# Patient Record
Sex: Female | Born: 1970
Health system: Southern US, Community
[De-identification: ages and names within clinical notes are randomized; demographics above are authoritative.]

## PROBLEM LIST (undated history)

## (undated) DIAGNOSIS — K219 Gastro-esophageal reflux disease without esophagitis: Secondary | ICD-10-CM

## (undated) DIAGNOSIS — C73 Malignant neoplasm of thyroid gland: Secondary | ICD-10-CM

## (undated) DIAGNOSIS — D649 Anemia, unspecified: Secondary | ICD-10-CM

## (undated) DIAGNOSIS — M797 Fibromyalgia: Secondary | ICD-10-CM

## (undated) DIAGNOSIS — O2690 Pregnancy related conditions, unspecified, unspecified trimester: Secondary | ICD-10-CM

## (undated) DIAGNOSIS — Z9889 Other specified postprocedural states: Secondary | ICD-10-CM

## (undated) DIAGNOSIS — E063 Autoimmune thyroiditis: Secondary | ICD-10-CM

## (undated) DIAGNOSIS — R112 Nausea with vomiting, unspecified: Secondary | ICD-10-CM

## (undated) DIAGNOSIS — R011 Cardiac murmur, unspecified: Secondary | ICD-10-CM

## (undated) HISTORY — DX: Anemia, unspecified: D64.9

## (undated) HISTORY — DX: Cardiac murmur, unspecified: R01.1

## (undated) HISTORY — DX: Malignant neoplasm of thyroid gland: C73

## (undated) HISTORY — DX: Pregnancy related conditions, unspecified, unspecified trimester: O26.90

## (undated) HISTORY — DX: Gastro-esophageal reflux disease without esophagitis: K21.9

## (undated) HISTORY — DX: Fibromyalgia: M79.7

## (undated) HISTORY — DX: Nausea with vomiting, unspecified: R11.2

## (undated) HISTORY — DX: Other specified postprocedural states: Z98.890

## (undated) HISTORY — PX: LAPAROSCOPY: SHX197

## (undated) HISTORY — DX: Autoimmune thyroiditis: E06.3

---

## 1997-08-14 ENCOUNTER — Other Ambulatory Visit: Admission: RE | Admit: 1997-08-14 | Discharge: 1997-08-14 | Payer: Self-pay | Admitting: Family Medicine

## 1998-03-23 ENCOUNTER — Other Ambulatory Visit: Admission: RE | Admit: 1998-03-23 | Discharge: 1998-03-23 | Payer: Self-pay | Admitting: *Deleted

## 1998-06-15 ENCOUNTER — Emergency Department (HOSPITAL_COMMUNITY): Admission: EM | Admit: 1998-06-15 | Discharge: 1998-06-15 | Payer: Self-pay | Admitting: Family Medicine

## 1998-08-06 ENCOUNTER — Encounter (INDEPENDENT_AMBULATORY_CARE_PROVIDER_SITE_OTHER): Payer: Self-pay

## 1998-08-06 ENCOUNTER — Ambulatory Visit (HOSPITAL_COMMUNITY): Admission: RE | Admit: 1998-08-06 | Discharge: 1998-08-06 | Payer: Self-pay | Admitting: *Deleted

## 1999-03-24 ENCOUNTER — Other Ambulatory Visit: Admission: RE | Admit: 1999-03-24 | Discharge: 1999-03-24 | Payer: Self-pay | Admitting: Obstetrics and Gynecology

## 2000-03-28 ENCOUNTER — Other Ambulatory Visit: Admission: RE | Admit: 2000-03-28 | Discharge: 2000-03-28 | Payer: Self-pay | Admitting: Obstetrics and Gynecology

## 2000-04-19 ENCOUNTER — Encounter (INDEPENDENT_AMBULATORY_CARE_PROVIDER_SITE_OTHER): Payer: Self-pay | Admitting: Specialist

## 2000-04-19 ENCOUNTER — Ambulatory Visit (HOSPITAL_COMMUNITY): Admission: RE | Admit: 2000-04-19 | Discharge: 2000-04-19 | Payer: Self-pay | Admitting: Gastroenterology

## 2001-04-03 ENCOUNTER — Other Ambulatory Visit: Admission: RE | Admit: 2001-04-03 | Discharge: 2001-04-03 | Payer: Self-pay | Admitting: Obstetrics and Gynecology

## 2002-02-21 DIAGNOSIS — C73 Malignant neoplasm of thyroid gland: Secondary | ICD-10-CM

## 2002-02-21 DIAGNOSIS — E063 Autoimmune thyroiditis: Secondary | ICD-10-CM

## 2002-02-21 HISTORY — PX: THYROIDECTOMY: SHX17

## 2002-02-21 HISTORY — DX: Malignant neoplasm of thyroid gland: C73

## 2002-02-21 HISTORY — DX: Autoimmune thyroiditis: E06.3

## 2002-03-05 ENCOUNTER — Inpatient Hospital Stay (HOSPITAL_COMMUNITY): Admission: AD | Admit: 2002-03-05 | Discharge: 2002-03-08 | Payer: Self-pay | Admitting: Obstetrics and Gynecology

## 2002-03-06 ENCOUNTER — Encounter (INDEPENDENT_AMBULATORY_CARE_PROVIDER_SITE_OTHER): Payer: Self-pay | Admitting: Specialist

## 2002-04-17 ENCOUNTER — Other Ambulatory Visit: Admission: RE | Admit: 2002-04-17 | Discharge: 2002-04-17 | Payer: Self-pay | Admitting: Obstetrics and Gynecology

## 2002-08-29 ENCOUNTER — Encounter: Payer: Self-pay | Admitting: Family Medicine

## 2002-08-29 ENCOUNTER — Encounter: Admission: RE | Admit: 2002-08-29 | Discharge: 2002-08-29 | Payer: Self-pay | Admitting: Family Medicine

## 2002-09-05 ENCOUNTER — Ambulatory Visit (HOSPITAL_COMMUNITY): Admission: RE | Admit: 2002-09-05 | Discharge: 2002-09-05 | Payer: Self-pay | Admitting: Family Medicine

## 2002-09-05 ENCOUNTER — Encounter (INDEPENDENT_AMBULATORY_CARE_PROVIDER_SITE_OTHER): Payer: Self-pay

## 2002-09-05 ENCOUNTER — Encounter: Payer: Self-pay | Admitting: Family Medicine

## 2002-11-01 ENCOUNTER — Encounter: Payer: Self-pay | Admitting: Surgery

## 2002-11-01 ENCOUNTER — Encounter (INDEPENDENT_AMBULATORY_CARE_PROVIDER_SITE_OTHER): Payer: Self-pay | Admitting: *Deleted

## 2002-11-01 ENCOUNTER — Observation Stay (HOSPITAL_COMMUNITY): Admission: RE | Admit: 2002-11-01 | Discharge: 2002-11-02 | Payer: Self-pay | Admitting: Surgery

## 2002-12-02 ENCOUNTER — Encounter (HOSPITAL_COMMUNITY): Admission: RE | Admit: 2002-12-02 | Discharge: 2003-03-02 | Payer: Self-pay | Admitting: Endocrinology

## 2002-12-05 ENCOUNTER — Encounter: Payer: Self-pay | Admitting: Endocrinology

## 2002-12-09 ENCOUNTER — Encounter: Payer: Self-pay | Admitting: Endocrinology

## 2003-04-25 ENCOUNTER — Other Ambulatory Visit: Admission: RE | Admit: 2003-04-25 | Discharge: 2003-04-25 | Payer: Self-pay | Admitting: Obstetrics and Gynecology

## 2003-07-28 ENCOUNTER — Encounter (HOSPITAL_COMMUNITY): Admission: RE | Admit: 2003-07-28 | Discharge: 2003-10-26 | Payer: Self-pay | Admitting: Endocrinology

## 2004-09-13 ENCOUNTER — Encounter (HOSPITAL_COMMUNITY): Admission: RE | Admit: 2004-09-13 | Discharge: 2004-12-12 | Payer: Self-pay | Admitting: Endocrinology

## 2004-09-28 ENCOUNTER — Ambulatory Visit (HOSPITAL_COMMUNITY): Admission: RE | Admit: 2004-09-28 | Discharge: 2004-09-28 | Payer: Self-pay | Admitting: Endocrinology

## 2004-12-02 ENCOUNTER — Encounter (INDEPENDENT_AMBULATORY_CARE_PROVIDER_SITE_OTHER): Payer: Self-pay | Admitting: *Deleted

## 2004-12-02 ENCOUNTER — Ambulatory Visit (HOSPITAL_COMMUNITY): Admission: RE | Admit: 2004-12-02 | Discharge: 2004-12-03 | Payer: Self-pay | Admitting: Surgery

## 2006-05-23 ENCOUNTER — Encounter: Admission: RE | Admit: 2006-05-23 | Discharge: 2006-05-23 | Payer: Self-pay | Admitting: Obstetrics and Gynecology

## 2006-07-10 ENCOUNTER — Ambulatory Visit: Payer: Self-pay | Admitting: Family Medicine

## 2006-07-10 DIAGNOSIS — R21 Rash and other nonspecific skin eruption: Secondary | ICD-10-CM | POA: Insufficient documentation

## 2006-07-12 ENCOUNTER — Telehealth: Payer: Self-pay | Admitting: Family Medicine

## 2006-07-18 ENCOUNTER — Ambulatory Visit: Payer: Self-pay | Admitting: Family Medicine

## 2006-07-18 DIAGNOSIS — L259 Unspecified contact dermatitis, unspecified cause: Secondary | ICD-10-CM | POA: Insufficient documentation

## 2006-07-20 ENCOUNTER — Ambulatory Visit: Payer: Self-pay | Admitting: Family Medicine

## 2006-08-07 ENCOUNTER — Encounter: Payer: Self-pay | Admitting: Family Medicine

## 2008-12-12 ENCOUNTER — Ambulatory Visit: Payer: Self-pay | Admitting: Family Medicine

## 2008-12-12 DIAGNOSIS — B354 Tinea corporis: Secondary | ICD-10-CM | POA: Insufficient documentation

## 2008-12-13 LAB — CONVERTED CEMR LAB
ALT: 11 units/L (ref 0–35)
CO2: 24 meq/L (ref 19–32)
Chloride: 105 meq/L (ref 96–112)
Creatinine, Ser: 0.71 mg/dL (ref 0.40–1.20)
Potassium: 4.6 meq/L (ref 3.5–5.3)
Sodium: 140 meq/L (ref 135–145)

## 2009-02-04 ENCOUNTER — Encounter: Admission: RE | Admit: 2009-02-04 | Discharge: 2009-02-04 | Payer: Self-pay | Admitting: Surgery

## 2009-03-26 ENCOUNTER — Ambulatory Visit (HOSPITAL_COMMUNITY): Admission: RE | Admit: 2009-03-26 | Discharge: 2009-03-26 | Payer: Self-pay | Admitting: Otolaryngology

## 2009-10-07 ENCOUNTER — Ambulatory Visit: Payer: Self-pay | Admitting: Family Medicine

## 2009-10-07 DIAGNOSIS — T22119A Burn of first degree of unspecified forearm, initial encounter: Secondary | ICD-10-CM | POA: Insufficient documentation
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
## 2010-05-14 ENCOUNTER — Other Ambulatory Visit: Payer: Self-pay | Admitting: Obstetrics and Gynecology

## 2010-05-14 LAB — CBC
Hemoglobin: 14.8 g/dL (ref 12.0–15.0)
MCV: 90.8 fL (ref 78.0–100.0)
Platelets: 281 10*3/uL (ref 150–400)
RBC: 4.76 MIL/uL (ref 3.87–5.11)
WBC: 8.9 10*3/uL (ref 4.0–10.5)

## 2010-05-14 LAB — APTT: aPTT: 29 seconds (ref 24–37)

## 2010-05-14 LAB — PROTIME-INR
INR: 0.99 (ref 0.00–1.49)
Prothrombin Time: 13 seconds (ref 11.6–15.2)

## 2010-06-15 ENCOUNTER — Other Ambulatory Visit: Payer: Self-pay | Admitting: Obstetrics and Gynecology

## 2010-06-15 DIAGNOSIS — Z1231 Encounter for screening mammogram for malignant neoplasm of breast: Secondary | ICD-10-CM

## 2010-06-29 ENCOUNTER — Encounter: Payer: Self-pay | Admitting: Family Medicine

## 2010-06-29 ENCOUNTER — Ambulatory Visit (INDEPENDENT_AMBULATORY_CARE_PROVIDER_SITE_OTHER): Payer: 59 | Admitting: Family Medicine

## 2010-06-29 DIAGNOSIS — R011 Cardiac murmur, unspecified: Secondary | ICD-10-CM

## 2010-06-29 DIAGNOSIS — R002 Palpitations: Secondary | ICD-10-CM

## 2010-06-29 DIAGNOSIS — K219 Gastro-esophageal reflux disease without esophagitis: Secondary | ICD-10-CM

## 2010-06-29 MED ORDER — RANITIDINE HCL 150 MG PO TABS: 150.0000 mg | ORAL_TABLET | Freq: Two times a day (BID) | ORAL | Status: DC

## 2010-06-29 NOTE — Patient Instructions (Addendum)
Adhere to reflux precautions.   Start Zantac 2 x a day.  2D echo ordered. Return for recheck BP/ palpitations in 1 month.  Diet for GERD or PUD Nutrition therapy can help ease the discomfort of gastroesophageal reflux disease (GERD) and peptic ulcer disease (PUD).  HOME CARE INSTRUCTIONS  Eat your meals slowly, in a relaxed setting.   Eat 5 to 6 small meals per day.   If a food causes distress, stop eating it for a period of time.  FOODS TO AVOID:  Coffee, regular or decaffeinated.  Cola beverages, regular or low calorie.   Tea, regular or decaffeinated.   Pepper.   Cocoa.   High fat foods including meats.   Butter, margarine, hydrogenated oil (trans fats).  Peppermint or spearmint (if you have GERD).   Fruits and vegetables as tolerated.   Alcoholic beverages.   Nicotine (smoking or chewing). This is one of the most potent stimulants to acid production in the gastrointestinal tract.   Any food that seems to aggravate your condition.   If you have questions regarding your diet, call your caregiver's office or a registered dietitian. OTHER TIPS IF YOU HAVE GERD:  Lying flat may make symptoms worse. Keep the head of your bed raised 6 to 9 inches by using a foam wedge or blocks under the legs of the bed.   Do not lay down until 3 hours after eating a meal.   Daily physical activity may help reduce symptoms.  MAKE SURE YOU:   Understand these instructions.   Will watch your condition.   Will get help right away if you are not doing well or get worse.  Document Released: 02/07/2005 Document Re-Released: 06/26/2008 [facility] Patient Information 2011 [place], [place].

## 2010-06-29 NOTE — Assessment & Plan Note (Signed)
New finding of systolic murmur.  Since she also has new onset palpitations and may have had radiation from her thyroid cancer to this region, will get a 2D echo and f/u results.

## 2010-06-29 NOTE — Assessment & Plan Note (Signed)
Sara Payne likely has reflux given her presentation of symptoms after eating too much spicy [place] food on 2 separate occasions.  Will start her on Zantac 150 mg bid everyday for the next [redacted] wks along with lifestyle changes.  H/o given to pt on reflux precautions.

## 2010-06-29 NOTE — Progress Notes (Signed)
  Subjective:    Patient ID: Sara Payne, female    DOB: 01-08-1971, 40 y.o.   MRN: [id number]  HPI 40 yo WF presents for feeling bad one night after eating tacos.  She thinks she ate too much.  She had a racing heart that night.  She called call a nurse.  She was not short of breath.  Denies feeling heartburn, epigastric pain or shortness of breath.  She did not feel like her heart was beating too fast, it was just beating hard.  She had just had her thyroid checked in march and it was normal.  She did not take anything that night but the following morning, she had chest pain.  She went to the gym and had chest pain that morning.  [facility]ent to [facility] and had CXR and EKG at the time of the constant chest pain.  She was told that she had acid reflux.  She was told to take prevacid but it has not helped.  She is feeling more heartburn.  She then changed to prilosec which helped.  She feels like the Prilosec is giving her heart palpitations.  She ate [place] Sat night and she had palpitations for hours after eating.  She consumes 0-1 caffeinated drinks/ day.  She tried gas ex.  Her stools are a little looser but no melena or blood in her stool.  She had been stressed out at the time.  She had not been sleeping as well.     Review of Systems  Constitutional: Negative for fever, appetite change, fatigue and unexpected weight change.  HENT: Negative for neck pain.   Respiratory: Negative for cough, choking, chest tightness, shortness of breath, wheezing and stridor.   Cardiovascular: Positive for chest pain and palpitations. Negative for leg swelling.  Gastrointestinal: Negative for nausea, vomiting, abdominal pain, diarrhea, constipation, blood in stool and abdominal distention.  Skin: Negative for color change and pallor.  Neurological: Negative for light-headedness and headaches.  Psychiatric/Behavioral: Negative for dysphoric mood. The patient is nervous/anxious.        Objective:   Physical Exam    Constitutional: She appears well-developed and well-nourished. No distress.  HENT:  Head: Normocephalic and atraumatic.  Eyes: Conjunctivae are normal. No scleral icterus.  Neck: Neck supple. No thyromegaly present.  Cardiovascular: Normal rate and regular rhythm.   Murmur heard.  Systolic murmur is present with a grade of 2/6  Pulmonary/Chest: Effort normal and breath sounds normal. No respiratory distress. She has no wheezes.  Abdominal: Bowel sounds are normal. She exhibits no distension. There is no tenderness. There is no guarding.  Musculoskeletal: She exhibits no edema.  Lymphadenopathy:    She has no cervical adenopathy.  Skin: Skin is warm and dry. No pallor.  Psychiatric: She has a normal mood and affect.          Assessment & Plan:

## 2010-06-29 NOTE — Assessment & Plan Note (Signed)
No sign of arrythmia today.  Had a normal CXR and EKG at [facility] for the same 2 wks ago, will get records.  Also had normal labs.  Will proceed with the echo given concurrent soft systolic murmur and if treating her reflux does not resolve the palpitations, will get her in with cards for a holter monitor.

## 2010-07-08 ENCOUNTER — Telehealth: Payer: Self-pay | Admitting: Family Medicine

## 2010-07-08 DIAGNOSIS — R002 Palpitations: Secondary | ICD-10-CM

## 2010-07-08 NOTE — Telephone Encounter (Signed)
Pt called and wants to know if she should continue with the Zantac 150 mg PO BID.  Having problems.  Seen last week for palpitations and placed on medication (Zantac) and she didn't take med yesterday and she didn't have any problems.  Today took pill this morning and today she feels like she can't get her breathe.  Had a few flutters today.  Slept sitting in upward position last night.  Pt sounds as though she is doing all the right things for the reflux. When first placed on Zantac the palpitations went away, and didn't take med yest and today starting with palpitations again.   Please advise. Plan:  Routed to Dr. Arlice Colt, LPN Domingo Dimes

## 2010-07-08 NOTE — Telephone Encounter (Signed)
OK to stay OFF Zantac and I will go ahead and set her up to see Dr Jens Som (cardiologist downstairs) for Holter monitoring.

## 2010-07-09 ENCOUNTER — Telehealth: Payer: Self-pay | Admitting: Family Medicine

## 2010-07-09 DIAGNOSIS — K219 Gastro-esophageal reflux disease without esophagitis: Secondary | ICD-10-CM

## 2010-07-09 NOTE — Op Note (Signed)
Sara Payne, Sara Payne                           ACCOUNT NO.:  [id number]   MEDICAL RECORD NO.:  [id number]                   PATIENT TYPE:  OBV   LOCATION:  0468                                 FACILITY:  [facility]   PHYSICIAN:  Velora Heckler, M.D.                DATE OF BIRTH:  Apr 15, 1970   DATE OF PROCEDURE:  11/01/2002  DATE OF DISCHARGE:                                 OPERATIVE REPORT   PREOPERATIVE DIAGNOSIS:  Thyroid nodule.   POSTOPERATIVE DIAGNOSIS:  Papillary thyroid carcinoma with regional lymph  node metastasis.   PROCEDURES:  1. Total thyroidectomy.  2. Limited regional lymph node dissection.   SURGEON:  Velora Heckler, M.D.   ASSISTANT:  Currie Paris, M.D.   ANESTHESIA:  General by Dr. Leta Jungling.   ESTIMATED BLOOD LOSS:  Minimal.   PREPARATION:  Betadine.   COMPLICATIONS:  None.   INDICATIONS:  The patient is a pleasant, 40 year old, white female, seen at  the request of Dorisann Frames, M.D. for thyroid nodule with atypia on fine  needle aspiration.  The patient had been found to have a thyroid nodule on  physical exam by Lemmie Evens, M.D. in May 2004.  She underwent thyroid  ultrasound.  This showed a solid, ill-defined mass worrisome for carcinoma.  The patient was seen by Dr. Talmage Nap and underwent fine needle aspiration  cytology.  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  The patient was then referred to general surgery for  resection.   DESCRIPTION OF PROCEDURE:  The procedure is done in OR #6 at the [facility].  The patient is brought to the operating room and placed  in a supine position on the operating room table.  Following the  administration of general anesthesia, the patient is positioned and then  prepped and draped in the usual strict aseptic fashion.  After ascertaining  that an adequate level of anesthesia had been obtained, a Kocher incision is  made with a #10 blade.   Dissection is carried down through the skin and  subcutaneous tissues.  Platysma is divided, and hemostasis is obtained with  the electrocautery.  Skin flaps are raised cephalad and caudad from the  thyroid notch to the sternal notch.  A Mahorner self-retaining retractor is  placed for exposure.  Strap muscles are incised in the midline, and  dissection is begun on the left side.  Strap muscles are reflected laterally  off the surface of the thyroid.  Thyroid is quite firm.  The nodular density  occupies essentially the entire left lobe.  Venous tributaries are divided  between small Ligaclips.  Superior pole is mobilized and ligated in  continuity with 2-0 silk ties and medium Ligaclips and divided.  Gland is  rolled anteriorly.  Branches of the inferior thyroid artery are divided  between small Ligaclips.  Parathyroid tissue is  identified and preserved.  Gland is rolled further anteriorly.  Branches of the inferior thyroid artery  are divided between small Ligaclips.  Ligament of Allyson Sabal is then transected,  and the gland is rolled up and onto the anterior surface of the trachea.  Isthmus is mobilized with the electrocautery.  The isthmus is divided at its  junction with the right thyroid lobe between hemostats.  Specimen is  sectioned on the field and has an ill-defined, fibrous mass in the left  lobe.  It is submitted to pathology for review.  Right lobe is suture  ligated with 3-0 Vicryl suture ligatures.   Marcie Bal, M.D. reviewed the left lobe on frozen section and confirms  papillary thyroid carcinoma.  Also, a left jugular lymph node was dissected  out from behind the carotid sheath.  Vascular pedicle lymph node was divided  between medium Ligaclips.  Node measured approximately 1 cm in size.  It  also was submitted for frozen section, and Marcie Bal, M.D. confirms  metastatic papillary carcinoma involving this lymph node.   At this point, a decision is made to proceed  with right thyroid lobectomy.  Strap muscles are again reflected laterally.  Venous tributaries are divided  between small Ligaclips.  Superior pole vessels are again ligated in  continuity with 2-0 silk ties and medium Ligaclips and divided.  Parathyroid  tissue is identified and preserved.  Gland is rolled anteriorly.  There is a  large tubercle of Zucker candle on the right side which is carefully  dissected out.  Recurrent laryngeal nerve is identified and preserved.  Branches of the inferior thyroid artery are divided between small Ligaclips.  Gland is rolled anteriorly and resected off of the trachea using the  electrocautery for hemostasis.  Right lobe is submitted to pathology for  permanent review.  The anterior compartment lymph nodes are excised using  the electrocautery for hemostasis.  A Delphian lymph node just above the  isthmus and to the right of midline is also resected and submitted.  Good  hemostasis is noted throughout.  Surgicel is placed in the area of the lymph  node resections as well as over the recurrent laryngeal nerves bilaterally.  Strap muscles are then reapproximated in the midline with interrupted 3-0  Vicryl sutures.  Platysma is closed with interrupted 3-0 Vicryl sutures.  Skin edges are reapproximated with a running 4-0 Vicryl subcuticular suture.  Wound is washed and dried, and Benzoin and Steri-Strips are applied.  Sterile gauze dressings are applied.  The patient is awakened from  anesthesia and brought to the recovery room in stable condition.  The  patient tolerated the procedure well.                                               Velora Heckler, M.D.    TMG/MEDQ  D:  11/01/2002  T:  11/02/2002  Job:  [id number]   cc:   Dorisann Frames, M.D.  [address] [place] [address]  Fax: [phone]   Danielle L. Mahaffey, M.D.  [address]  [place]  [place] [address]  Fax: [phone]  Lemmie Evens, M.D.  [address]   [place]  [place] [address]  Fax: [phone]

## 2010-07-09 NOTE — Procedures (Signed)
[facility]  Patient:    Sara Payne, Sara Payne                        MRN: [id number] Proc. Date: 04/19/00 Adm. Date:  [id number] Attending:  Louie Bun CC:         Doreatha Lew, M.D.                           Procedure Report  PROCEDURE:  Esophagogastroduodenoscopy.  ENDOSCOPIST:  Everardo All. Madilyn Fireman, M.D.  INDICATIONS:  Reflux-like dyspepsia of sudden onset several months ago with no sustained response to acid suppression.  DESCRIPTION OF PROCEDURE:  The patient was placed in the left lateral decubitus position and placed on the pulse monitor with continuous low flow oxygen delivered by nasal cannula.  She was sedated with 50 mg of IV Demerol and 7 mg of IV Versed.  The Olympus video endoscope was advanced under direct vision into the oropharynx and esophagus.  The esophagus was straight and of normal caliber with the squamocolumnar line at 38 cm.  There was no visible hiatal hernia, ring, stricture or other abnormality of the GE junction.  The stomach was entered, and a small amount of liquid secretions were suctioned from the fundus.  Retroflexed view of the cardia was unremarkable.  The fundus, body, antrum and pylorus all appeared normal.  The duodenum was entered.  Both the bulb and second portion were inspected and appeared to be within normal limits.  The scope was advanced as far as possible down into the duodenum, and biopsies were taken to rule out celiac disease.  The scope was withdrawn back into the stomach and CLO test was obtained.  The scope was then withdrawn, and the patient returned to the recovery room in stable condition. He tolerated the procedure well, and there were no immediate complications.  IMPRESSION:  Essentially normal endoscopy.  PLAN:  Await CLO test and small bowel biopsies.  If negative, we will consider a course of Reglan and possible gallbladder ultrasound. DD:  04/20/00 TD:  04/20/00 Job: [id number]

## 2010-07-09 NOTE — Op Note (Signed)
NAMEMARENDA, Sara Payne                 ACCOUNT NO.:  [id number]   MEDICAL RECORD NO.:  [id number]          PATIENT TYPE:  OIB   LOCATION:  1611                         FACILITY:  [facility]   PHYSICIAN:  Velora Heckler, MD      DATE OF BIRTH:  09-08-1970   DATE OF PROCEDURE:  12/02/2004  DATE OF DISCHARGE:                                 OPERATIVE REPORT   PREOPERATIVE DIAGNOSIS:  Left internal jugular lymphadenopathy, rule out  recurrent papillary thyroid carcinoma.   POSTOPERATIVE DIAGNOSIS:  Left internal jugular lymphadenopathy, rule out  recurrent papillary thyroid carcinoma.   PROCEDURE:  Left internal jugular lymph node excision.   SURGEON:  Velora Heckler, M.D.   ANESTHESIA:  General.   ESTIMATED BLOOD LOSS:  Minimal.   PREPARATION:  Betadine.   COMPLICATIONS:  None.   INDICATIONS:  The patient is a 40 year old white female from [place],  [place], well known to my surgical practice.  The patient had  undergone total thyroidectomy September 2004 for papillary thyroid carcinoma  with lymph node metastasis.  The patient was treated postoperatively with  radioactive iodine. The patient developed a globus sensation in the throat.  Thyroid ultrasound demonstrated a 2.4 cm lymph node in the left internal  jugular chain.  The patient was seen in consultation by Dr. Leonie Man at  [facility].  PET scan was obtained which showed no  evidence of metastatic disease. Radioactive iodine scanning showed no  evidence of recurrent disease.  The patient now comes to surgery for lymph  node excision.   DESCRIPTION OF PROCEDURE:  The procedure was done in OR #1 at the [facility].  The patient was brought to the operating room and  placed in the supine position on the operating room table.  Following  administration of general anesthesia, the patient is positioned and then  prepped and draped in the usual strict aseptic fashion.  After  ascertaining  that an adequate level of anesthesia been obtained, the left half of the  patient's previous Kocher incision is reopened with a #15 blade.  Dissection  was carried down through scar tissue. Platysma was divided.  Skin flaps were  elevated cephalad and caudad.  A Weitlaner r retractor was placed for  exposure.  Strap muscles were incised in the midline and reflected  laterally.  Dissection was carried out to the carotid sheath which was  carefully opened.  The carotid artery and vagus nerve were reflected  medially.  Jugular vein is reflected laterally.  The enlarged lymph node is  apparent just behind the jugular vein.  It is gently dissected out. Vascular  tributaries were divided between small and medium Ligaclips.  The entire  lymph node is excised.  It measures approximately 2.4 cm in greatest  dimension.  It is submitted fresh to pathology for Dr. Esther Hardy to  analyze on permanent sections.  Good hemostasis was noted.  A small piece of  Surgicel was placed in the bed of the lymph node.  The strap muscles were  reapproximated in  the midline with interrupted 3-0 Vicryl sutures.  Platysma  was closed with interrupted 3-0 Vicryl sutures. Skin was closed with a  running 4-0 Vicryl subcuticular suture.  Wound is washed and dried. Benzoin, Steri-Strips were applied.  Sterile  dressings were applied.  The patient is awakened from anesthesia and brought  to the recovery room in stable condition.  The patient tolerated the  procedure well.      Velora Heckler, MD  Electronically Signed     TMG/MEDQ  D:  12/02/2004  T:  12/03/2004  Job:  [id number]   cc:   Deboraha Sprang at Fernanda Drum, M.D.  Division of Endocrincology  [facility]

## 2010-07-09 NOTE — Telephone Encounter (Signed)
Pt called and she is experiencing more GI related symptoms along with heart palpitations.  Difficulty with breathing, and reflux after eating anything.  No better.   Plan:  Checked the status of referrals to the cardiologist.  Orders in system but the appt to see card and to do the 2-D echo not done.  Therefore, triage nurse called on behalf of the patient and scheduled card appt for 08-11-10 @ 4pm/ [place] office and 2-D echo appt/ [place] for 07-21-10 @1pm  .  LMOM for patient with all this pertinent information.  Told pt we will also like to refer her to [facility] in [place] to r/o reflux which seems to be significant at this point per Dr. Ovidio Kin order.  Pending pt call back to determine if willing to also do GI referral. Plan:  Routed to Dr. Cathey Endow for reference Jarvis Newcomer, LPN Domingo Dimes

## 2010-07-09 NOTE — Op Note (Signed)
[facility]  Patient:    Sara Payne, Sara Payne                        MRN: [id number] Proc. Date: 04/19/00 Adm. Date:  [id number] Attending:  Louie Bun CC:         Doreatha Lew, M.D.                           Operative Report  PROCEDURE:  Esophagogastroduodenoscopy.  ENDOSCOPIST:  Everardo All. Madilyn Fireman, M.D.  INDICATIONS: DD:  04/20/00 TD:  04/20/00 Job: [id number]

## 2010-07-09 NOTE — H&P (Signed)
   Sara Payne, Sara Payne                           ACCOUNT NO.:  [id number]   MEDICAL RECORD NO.:  [id number]                   PATIENT TYPE:  INP   LOCATION:  9162                                 FACILITY:  [facility]   PHYSICIAN:  Lenoard Aden, M.D.             DATE OF BIRTH:  [birth year]   DATE OF ADMISSION:  03/05/2002  DATE OF DISCHARGE:                                HISTORY & PHYSICAL   CHIEF COMPLAINT:  Spontaneous ruptured membranes.   HISTORY OF PRESENT ILLNESS:  The patient is a 40 year old white female, G1,  P0, EDD of March 13, 2001, at [redacted] weeks gestation who presents in active  labor status post spontaneous ruptured membranes.   ALLERGIES:  She has allergies to Z-PAK medications, PRENATAL VITAMINS.   PAST OB/GYN HISTORY:  History of SAB with D&E in 2000.  History of  laparoscopy in 2000 for presumed ectopic conception on Clomid.  Previous  history of Chlamydia.   FAMILY HISTORY:  Postpartum depression, kidney stones, breast cancer.   POST PRENATAL COURSE:  Complicated by size/date discrepancy with normal AGA  fetus with recent ultrasound performed on February 11, 2002, reveals an AGA  fetus, resolution of low-lying placenta and estimated fetal weight at the  29% percentile of approximately 5.5 pounds.   PHYSICAL EXAMINATION:  GENERAL:  She is a well-developed, well-nourished  white female in no apparent distress.  HEENT:  Normal.  LUNGS:  Clear.  HEART:  Regular rhythm.  ABDOMEN:  Soft, gravid, nontender.  Estimated fetal weight 6.5 to 7 pounds.  CERVIX:  2-3 cm, 80% minus 1, clear fluid noted.  EXTREMITIES:  There are no cords.  NEUROLOGIC EXAM:  Nonfocal.   IMPRESSION:  Term intrauterine pregnancy with spontaneous ruptured  membranes, GBS negative.   PLAN:  Pitocin augmentation, anticipated attempts at vaginal delivery.                                               Lenoard Aden, M.D.    RJT/MEDQ  D:  03/06/2002  T:  03/06/2002  Job:  [id number]

## 2010-07-09 NOTE — Op Note (Signed)
Sara Payne, Sara Payne                           ACCOUNT NO.:  [id number]   MEDICAL RECORD NO.:  [id number]                   PATIENT TYPE:  INP   LOCATION:  9162                                 [facility]ILITY[facility]   PHYSICIAN:  Lenoard Aden, M.D.             DATE OF BIRTH:  [birth year]   DATE OF PROCEDURE:  03/06/2002  DATE OF DISCHARGE:                                 OPERATIVE REPORT   INDICATIONS FOR OPERATIVE VAGINAL DELIVERY:  Maternal exhaustion.   DESCRIPTION OF PROCEDURE:  After maternal expulsive efforts proceeding x2-  1/2 hours, maternal exhaustion was noted.  Fetal vertex noted to be a +3 to  +4 station and straight OA.  No evidence of fetal heart rate decelerations.  Mityvac mushroom cup and vacuum assistance is applied as it is demonstrated  and explained to the patient and her husband.  Risks of possible scalp  laceration, cephalohematoma, and rare incidence of intracranial hemorrhage  are noted.  After consenting to procedure Mityvac mushroom cup is placed in  the proper location for four pulls with good progression over a central  median episiotomy of a full-term living female.  Upon delivery of the fetal  vertex bulb suctioning is performed.  Mild shoulder dystocia is encountered  which is relieved with McRoberts' maneuver and suprapubic pressure and the  administration of a second degree episiotomy.  Fetal Apgars are 7 and 9.  After achieving delivery of the fetus without difficulty, no lacerations are  noted.  Cervix is inspected and found to be without lacerations.  Upon  gentle traction of placenta, cord evulsion is noted.  Placenta is noted to  be right at the level of the internal os and is easily removed with efforts  with manual extraction.  Upon inspection of the placenta it appears to be  complete with evidence of a succenturiate lobe which is therefore sent to  pathology.  Brisk bleeding is noted in the third stage of labor.  Pitocin is  given IV and 40  units are added to the bag.  Bimanual massage is performed.  The patient has multiple episodes of uterine atone which respond to bimanual  compression.  No evidence of cervical or vaginal lacerations.  The uterus is  probed using a sponge forceps and no evidence of retained tissue or products  are palpable.  Digital exploration confirms.  At this time good hemostasis  is noted.  Central medial episiotomy is repaired with a 3-0 Vicryl Rapide in  a standard fashion.  Rectal examination is intact.  Estimated blood loss 600  cubic centimeters.  The patient tolerates procedure well and is recovering  with baby in good condition.  Lenoard Aden, M.D.    RJT/MEDQ  D:  03/06/2002  T:  03/06/2002  Job:  [id number]   cc:   [facility]

## 2010-07-09 NOTE — Telephone Encounter (Signed)
Pt returned call to triage nurse and she is willing to see GI at [place].   Plan:  Routed to Dr. Arlice Colt, LPN Domingo Dimes

## 2010-07-20 ENCOUNTER — Other Ambulatory Visit (HOSPITAL_COMMUNITY): Payer: Self-pay | Admitting: Radiology

## 2010-07-20 DIAGNOSIS — R011 Cardiac murmur, unspecified: Secondary | ICD-10-CM

## 2010-07-21 ENCOUNTER — Ambulatory Visit (HOSPITAL_COMMUNITY): Payer: 59 | Attending: Family Medicine | Admitting: Radiology

## 2010-07-21 ENCOUNTER — Telehealth: Payer: Self-pay | Admitting: Family Medicine

## 2010-07-21 DIAGNOSIS — I079 Rheumatic tricuspid valve disease, unspecified: Secondary | ICD-10-CM | POA: Insufficient documentation

## 2010-07-21 DIAGNOSIS — R011 Cardiac murmur, unspecified: Secondary | ICD-10-CM | POA: Insufficient documentation

## 2010-07-21 DIAGNOSIS — R002 Palpitations: Secondary | ICD-10-CM | POA: Insufficient documentation

## 2010-07-21 DIAGNOSIS — I379 Nonrheumatic pulmonary valve disorder, unspecified: Secondary | ICD-10-CM | POA: Insufficient documentation

## 2010-07-21 DIAGNOSIS — I059 Rheumatic mitral valve disease, unspecified: Secondary | ICD-10-CM | POA: Insufficient documentation

## 2010-07-21 NOTE — Telephone Encounter (Signed)
I called pt back with her echo results, looked great.  She does have an appt to see Dr Jens Som but it appears her heart palpitations are almost gone along with her reflux on Nexium qd.  She has seen [facility].

## 2010-07-27 ENCOUNTER — Encounter: Payer: Self-pay | Admitting: Cardiology

## 2010-08-10 ENCOUNTER — Ambulatory Visit: Payer: 59 | Admitting: Cardiology

## 2010-08-11 ENCOUNTER — Ambulatory Visit: Payer: 59 | Admitting: Cardiology

## 2010-08-18 ENCOUNTER — Ambulatory Visit (INDEPENDENT_AMBULATORY_CARE_PROVIDER_SITE_OTHER): Payer: 59 | Admitting: Gastroenterology

## 2010-08-18 ENCOUNTER — Ambulatory Visit: Payer: 59 | Admitting: Cardiology

## 2010-08-18 ENCOUNTER — Encounter: Payer: Self-pay | Admitting: Gastroenterology

## 2010-08-18 VITALS — BP 138/80 | HR 100 | Ht 59.0 in | Wt 114.2 lb

## 2010-08-18 DIAGNOSIS — K219 Gastro-esophageal reflux disease without esophagitis: Secondary | ICD-10-CM

## 2010-08-18 MED ORDER — ESOMEPRAZOLE MAGNESIUM 40 MG PO CPDR: 40.0000 mg | DELAYED_RELEASE_CAPSULE | Freq: Every day | ORAL | Status: DC

## 2010-08-18 NOTE — Patient Instructions (Addendum)
Your prescription for Nexium has been sent to your pharmacy.  Patient advised to avoid spicy, acidic, citrus, chocolate, mints, fruit and fruit juices.  Limit the intake of caffeine, alcohol and Soda.  Don't exercise too soon after eating.  Don't lie down within 3-4 hours of eating.  Elevate the head of your bed. cc: Seymour Bars, DO

## 2010-08-18 NOTE — Progress Notes (Signed)
History of Present Illness: This is a 40 year old female who relates problems with heartburn, intermittent chest pain, chest fluttering and globus for several months. She underwent a barium esophagram on May 25 in [place] which was normal. She has been tried on Prilosec, Pepcid and Zantac without improvement of symptoms. She started Nexium about one month ago and all her symptoms have resolved. She notes occasional belching with certain foods-often breads and peanut butter. She states she had celiac disease antibody testing done by her endocrinologist in [place] several months ago that was negative. She saw Dr. Dorena Cookey previously in 2002 and underwent upper endoscopy for dyspepsia and reflux-like symptoms the endoscopy was normal. Small bowel biopsies were also normal. She denies dysphagia, odynophagia, nausea, vomiting, chest pain, abdominal pain, change in bowel habits, melena, hematochezia, diarrhea and constipation.  Past Medical History  Diagnosis Date  . Thyroid cancer 2004  . Pregnancy complication     G2P1: 0,11  . Anemia   . Fibromyalgia   . GERD (gastroesophageal reflux disease)   . Hashimoto disease 2004   Past Surgical History  Procedure Date  . Thyroidectomy 2004    removed lymphnodes 2006  . Laparoscopy     reports that she has never smoked. She does not have any smokeless tobacco history on file. She reports that she does not drink alcohol or use illicit drugs. family history includes Breast cancer in her maternal grandmother; Heart disease in her father; and Hypertension in her father. Allergies  Allergen Reactions  . Zithromax (Azithromycin)     rash   Outpatient Encounter Prescriptions as of 08/18/2010  Medication Sig Dispense Refill  . calcium carbonate (OS-CAL) 600 MG TABS Take 600 mg by mouth daily.        Marland Kitchen esomeprazole (NEXIUM) 40 MG capsule Take 1 capsule (40 mg total) by mouth daily before breakfast.  30 capsule  11  . levothyroxine (SYNTHROID,  LEVOTHROID) 112 MCG tablet Take 112 mcg by mouth daily.        . Multiple Vitamin (MULTIVITAMIN) tablet Take 1 tablet by mouth daily.        . Norethindrone Acet-Ethinyl Est (LOESTRIN 1.5/30, 21,) 1.5-30 MG-MCG TABS Take by mouth.        . DISCONTD: esomeprazole (NEXIUM) 40 MG capsule Take 40 mg by mouth daily before breakfast.        . DISCONTD: cyclobenzaprine (FLEXERIL) 5 MG tablet Take 5 mg by mouth at bedtime as needed.        Marland Kitchen DISCONTD: ibuprofen (ADVIL,MOTRIN) 600 MG tablet Take 600 mg by mouth 3 (three) times daily as needed.        Marland Kitchen DISCONTD: Lorita Officer Triphasic (ORTHO TRI-CYCLEN, 28,) 0.18/0.215/0.25 MG-35 MCG TABS Take 1 tablet by mouth daily.        Marland Kitchen DISCONTD: ranitidine (ZANTAC) 150 MG tablet Take 1 tablet (150 mg total) by mouth 2 (two) times daily.  60 tablet  1  . DISCONTD: silver sulfADIAZINE (SILVADENE) 1 % cream Apply 1 application topically 2 (two) times daily.          Review of Systems: Pertinent positive and negative review of systems were noted in the above HPI section. All other review of systems were otherwise negative.  Physical Exam: General: Well developed , well nourished, no acute distress Head: Normocephalic and atraumatic Eyes:  sclerae anicteric, EOMI Ears: Normal auditory acuity Mouth: No deformity or lesions Neck: Supple, no masses, well-healed thyroidectomy scar Lungs: Clear throughout to auscultation Heart: Regular rate and rhythm;  no murmurs, rubs or bruits Abdomen: Soft, non tender and non distended. No masses, hepatosplenomegaly or hernias noted. Normal Bowel sounds Musculoskeletal: Symmetrical with no gross deformities  Skin: No lesions on visible extremities Pulses:  Normal pulses noted Extremities: No clubbing, cyanosis, edema or deformities noted Neurological: Alert oriented x 4, grossly nonfocal Cervical Nodes:  No significant cervical adenopathy Inguinal Nodes: No significant inguinal adenopathy Psychological:  Alert and  cooperative. Normal mood and affect  Assessment and Recommendations:  1. GERD. We discussed reflux and its long-term treatment. We discussed life style and dietary measures. She was given written information about managing reflux long-term. Continue Nexium 40 mg daily. In 1 to 2 months she can try tapering Nexium to every other day then every third day to try to discontinue. Followup with me as needed and with her primary physician.

## 2010-08-19 ENCOUNTER — Ambulatory Visit: Payer: Self-pay

## 2010-08-19 ENCOUNTER — Ambulatory Visit
Admission: RE | Admit: 2010-08-19 | Discharge: 2010-08-19 | Disposition: A | Discharge status: Home / Self Care | Payer: 59 | Source: Ambulatory Visit | Attending: Obstetrics and Gynecology | Admitting: Obstetrics and Gynecology

## 2010-08-19 DIAGNOSIS — Z1231 Encounter for screening mammogram for malignant neoplasm of breast: Secondary | ICD-10-CM

## 2010-08-24 ENCOUNTER — Other Ambulatory Visit: Payer: Self-pay | Admitting: Obstetrics and Gynecology

## 2010-08-24 DIAGNOSIS — R928 Other abnormal and inconclusive findings on diagnostic imaging of breast: Secondary | ICD-10-CM

## 2010-09-03 ENCOUNTER — Telehealth: Payer: Self-pay | Admitting: Family Medicine

## 2010-09-03 NOTE — Telephone Encounter (Signed)
Clydie Braun,  i cannot find where i have seen this pt and am not sure what test you have referred to that i ordered. Could you review this and clarify for me? Thanks,  Olga Millers

## 2010-09-03 NOTE — Telephone Encounter (Signed)
Test ordered by Dr Jens Som.  Will route to him.

## 2010-09-07 NOTE — Telephone Encounter (Signed)
Pt aware of the above  

## 2010-09-07 NOTE — Telephone Encounter (Signed)
Pls let pt know that her ECHO came back normal.  Valves and pumping function look great.

## 2010-09-08 ENCOUNTER — Ambulatory Visit
Admission: RE | Admit: 2010-09-08 | Discharge: 2010-09-08 | Disposition: A | Discharge status: Home / Self Care | Payer: 59 | Source: Ambulatory Visit | Attending: Obstetrics and Gynecology | Admitting: Obstetrics and Gynecology

## 2010-09-08 DIAGNOSIS — R928 Other abnormal and inconclusive findings on diagnostic imaging of breast: Secondary | ICD-10-CM

## 2010-12-09 ENCOUNTER — Telehealth: Payer: Self-pay | Admitting: Family Medicine

## 2010-12-09 MED ORDER — RANITIDINE HCL 150 MG PO TABS: 150.0000 mg | ORAL_TABLET | Freq: Two times a day (BID) | ORAL | Status: DC

## 2010-12-09 NOTE — Telephone Encounter (Signed)
Pt called because her insurance company is now forcing her to do mail order with all her prescriptions.  She has to use [facility].  She needs her ranitidine 150mg  PO BID sent to mail order. Plan:  Refilled ranitidine 90 day supply to her [facility] pharm. Jarvis Newcomer, LPN Domingo Dimes

## 2011-01-18 ENCOUNTER — Encounter: Payer: Self-pay | Admitting: Gastroenterology

## 2011-01-18 ENCOUNTER — Ambulatory Visit (INDEPENDENT_AMBULATORY_CARE_PROVIDER_SITE_OTHER): Payer: 59 | Admitting: Gastroenterology

## 2011-01-18 VITALS — BP 106/72 | HR 72 | Ht 59.0 in | Wt 108.4 lb

## 2011-01-18 DIAGNOSIS — K219 Gastro-esophageal reflux disease without esophagitis: Secondary | ICD-10-CM

## 2011-01-18 DIAGNOSIS — R0982 Postnasal drip: Secondary | ICD-10-CM

## 2011-01-18 NOTE — Progress Notes (Signed)
History of Present Illness: This is a 40 year old female who has had intermittent difficulties with heartburn and belching since I last saw her. Nexium cuased diarrhea so she discontinued it. She also feels that gluten products leading to diarrhea or mouth ulcers so she avoids them. Recently she's been taking ranitidine 150 mg twice daily or Prevacid 15 mg daily and both have been effective for controlling her reflux symptoms. Over the past month she has had intermittent postnasal drip unrelated to any reflux symptoms. Denies weight loss, abdominal pain, constipation, diarrhea, change in stool caliber, melena, hematochezia, nausea, vomiting, dysphagia, orchest pain.  Current Medications, Allergies, Past Medical History, Past Surgical History, Family History and Social History were reviewed in [employer] record.  Physical Exam: General: Well developed , well nourished, no acute distress Head: Normocephalic and atraumatic Eyes:  sclerae anicteric, EOMI Ears: Normal auditory acuity Mouth: No deformity or lesions Lungs: Clear throughout to auscultation Heart: Regular rate and rhythm; no murmurs, rubs or bruits Abdomen: Soft, non tender and non distended. No masses, hepatosplenomegaly or hernias noted. Normal Bowel sounds Extremities: No clubbing, cyanosis, edema or deformities noted Neurological: Alert oriented x 4, grossly nonfocal Psychological:  Alert and cooperative. Normal mood and affect  Assessment and Recommendations:  1. GERD. Symptoms easily controlled with antireflux measures and either ranitidine 150 mg twice daily or Prevacid 15 mg daily. She may take either of these medications on an as-needed basis for control of symptoms. Offered to proceed with upper endoscopy to evaluate for erosive esophagitis and Barrett's esophagus and she declines at this time.  2. Postnasal drip. This does not appear to have any relationship to her reflux problems and she has no other  symptoms typical for LPR. Further evaluation with her allergist.

## 2011-01-18 NOTE — Patient Instructions (Addendum)
You can use over the counter Zantac 150 mg one tablet twice daily or Prevacid 15 mg one tablet by mouth once daily for acid reflux.  Follow up as needed.  cc: Seymour Bars, DO

## 2011-05-10 DIAGNOSIS — E039 Hypothyroidism, unspecified: Secondary | ICD-10-CM | POA: Insufficient documentation

## 2011-05-10 DIAGNOSIS — C73 Malignant neoplasm of thyroid gland: Secondary | ICD-10-CM | POA: Insufficient documentation

## 2011-05-30 ENCOUNTER — Other Ambulatory Visit: Payer: Self-pay | Admitting: Obstetrics and Gynecology

## 2011-05-30 DIAGNOSIS — Z1231 Encounter for screening mammogram for malignant neoplasm of breast: Secondary | ICD-10-CM

## 2011-08-23 ENCOUNTER — Ambulatory Visit: Payer: 59

## 2011-08-30 ENCOUNTER — Ambulatory Visit
Admission: RE | Admit: 2011-08-30 | Discharge: 2011-08-30 | Disposition: A | Discharge status: Home / Self Care | Payer: 59 | Source: Ambulatory Visit | Attending: Obstetrics and Gynecology | Admitting: Obstetrics and Gynecology

## 2011-08-30 DIAGNOSIS — Z1231 Encounter for screening mammogram for malignant neoplasm of breast: Secondary | ICD-10-CM

## 2012-06-25 ENCOUNTER — Other Ambulatory Visit: Payer: Self-pay | Admitting: Otolaryngology

## 2012-06-25 DIAGNOSIS — C801 Malignant (primary) neoplasm, unspecified: Secondary | ICD-10-CM

## 2012-06-25 DIAGNOSIS — C73 Malignant neoplasm of thyroid gland: Secondary | ICD-10-CM

## 2012-06-25 DIAGNOSIS — E039 Hypothyroidism, unspecified: Secondary | ICD-10-CM

## 2012-06-25 DIAGNOSIS — C770 Secondary and unspecified malignant neoplasm of lymph nodes of head, face and neck: Secondary | ICD-10-CM

## 2012-06-25 DIAGNOSIS — R599 Enlarged lymph nodes, unspecified: Secondary | ICD-10-CM

## 2012-07-12 ENCOUNTER — Ambulatory Visit
Admission: RE | Admit: 2012-07-12 | Discharge: 2012-07-12 | Disposition: A | Discharge status: Home / Self Care | Payer: 59 | Source: Ambulatory Visit | Attending: Otolaryngology | Admitting: Otolaryngology

## 2012-07-12 DIAGNOSIS — C801 Malignant (primary) neoplasm, unspecified: Secondary | ICD-10-CM

## 2012-07-12 DIAGNOSIS — C770 Secondary and unspecified malignant neoplasm of lymph nodes of head, face and neck: Secondary | ICD-10-CM

## 2012-07-12 DIAGNOSIS — R599 Enlarged lymph nodes, unspecified: Secondary | ICD-10-CM

## 2012-07-12 DIAGNOSIS — C73 Malignant neoplasm of thyroid gland: Secondary | ICD-10-CM

## 2012-07-12 DIAGNOSIS — E039 Hypothyroidism, unspecified: Secondary | ICD-10-CM

## 2012-07-12 MED ORDER — IOHEXOL 300 MG/ML  SOLN
75.0000 mL | Freq: Once | INTRAMUSCULAR | Status: AC | PRN
  Administered 2012-07-12: 75 mL via INTRAVENOUS

## 2012-07-19 ENCOUNTER — Other Ambulatory Visit (HOSPITAL_COMMUNITY): Payer: Self-pay | Admitting: Otolaryngology

## 2012-07-19 ENCOUNTER — Other Ambulatory Visit: Payer: Self-pay | Admitting: Otolaryngology

## 2012-07-19 DIAGNOSIS — R599 Enlarged lymph nodes, unspecified: Secondary | ICD-10-CM

## 2012-07-23 ENCOUNTER — Other Ambulatory Visit: Payer: Self-pay

## 2012-07-23 DIAGNOSIS — Z1231 Encounter for screening mammogram for malignant neoplasm of breast: Secondary | ICD-10-CM

## 2012-07-24 ENCOUNTER — Other Ambulatory Visit: Payer: Self-pay | Admitting: Radiology

## 2012-07-25 ENCOUNTER — Encounter (HOSPITAL_COMMUNITY): Payer: Self-pay | Admitting: Pharmacy Technician

## 2012-08-01 ENCOUNTER — Ambulatory Visit (HOSPITAL_COMMUNITY)
Admission: RE | Admit: 2012-08-01 | Discharge: 2012-08-01 | Disposition: A | Discharge status: Home / Self Care | Payer: 59 | Source: Ambulatory Visit | Attending: Otolaryngology | Admitting: Otolaryngology

## 2012-08-01 DIAGNOSIS — C73 Malignant neoplasm of thyroid gland: Secondary | ICD-10-CM | POA: Insufficient documentation

## 2012-08-01 DIAGNOSIS — R599 Enlarged lymph nodes, unspecified: Secondary | ICD-10-CM

## 2012-08-01 NOTE — Procedures (Signed)
[place] FNA OF BILATERAL SM LNs No comp Stable Path pending

## 2012-08-02 ENCOUNTER — Telehealth (HOSPITAL_COMMUNITY): Payer: Self-pay | Admitting: *Deleted

## 2012-08-30 ENCOUNTER — Ambulatory Visit
Admission: RE | Admit: 2012-08-30 | Discharge: 2012-08-30 | Disposition: A | Discharge status: Home / Self Care | Payer: 59 | Source: Ambulatory Visit

## 2012-08-30 DIAGNOSIS — Z1231 Encounter for screening mammogram for malignant neoplasm of breast: Secondary | ICD-10-CM

## 2012-09-03 ENCOUNTER — Other Ambulatory Visit: Payer: Self-pay | Admitting: Obstetrics and Gynecology

## 2012-09-03 DIAGNOSIS — R928 Other abnormal and inconclusive findings on diagnostic imaging of breast: Secondary | ICD-10-CM

## 2012-09-13 ENCOUNTER — Ambulatory Visit
Admission: RE | Admit: 2012-09-13 | Discharge: 2012-09-13 | Disposition: A | Discharge status: Home / Self Care | Payer: 59 | Source: Ambulatory Visit | Attending: Obstetrics and Gynecology | Admitting: Obstetrics and Gynecology

## 2012-09-13 DIAGNOSIS — R928 Other abnormal and inconclusive findings on diagnostic imaging of breast: Secondary | ICD-10-CM

## 2013-08-12 ENCOUNTER — Other Ambulatory Visit: Payer: Self-pay

## 2013-08-12 DIAGNOSIS — Z1231 Encounter for screening mammogram for malignant neoplasm of breast: Secondary | ICD-10-CM

## 2013-09-05 ENCOUNTER — Ambulatory Visit
Admission: RE | Admit: 2013-09-05 | Discharge: 2013-09-05 | Disposition: A | Discharge status: Home / Self Care | Payer: 59 | Source: Ambulatory Visit

## 2013-09-05 ENCOUNTER — Encounter (INDEPENDENT_AMBULATORY_CARE_PROVIDER_SITE_OTHER): Payer: Self-pay

## 2013-09-05 DIAGNOSIS — Z1231 Encounter for screening mammogram for malignant neoplasm of breast: Secondary | ICD-10-CM

## 2014-08-22 ENCOUNTER — Other Ambulatory Visit: Payer: Self-pay

## 2014-08-22 DIAGNOSIS — Z1231 Encounter for screening mammogram for malignant neoplasm of breast: Secondary | ICD-10-CM

## 2014-09-11 ENCOUNTER — Ambulatory Visit
Admission: RE | Admit: 2014-09-11 | Discharge: 2014-09-11 | Disposition: A | Discharge status: Home / Self Care | Payer: 59 | Source: Ambulatory Visit

## 2014-09-11 DIAGNOSIS — Z1231 Encounter for screening mammogram for malignant neoplasm of breast: Secondary | ICD-10-CM

## 2014-10-24 LAB — BASIC METABOLIC PANEL
BUN: 11 (ref 4–21)
CO2: 27 — AB (ref 13–22)
Chloride: 107 (ref 99–108)
Creatinine: 0.7 (ref 0.5–1.1)
Glucose: 92
Potassium: 4.5 (ref 3.4–5.3)
Sodium: 143 (ref 137–147)

## 2014-10-24 LAB — HEPATIC FUNCTION PANEL
ALT: 11 (ref 7–35)
AST: 14 (ref 13–35)
Alkaline Phosphatase: 51 (ref 25–125)
Bilirubin, Total: 0.6

## 2014-10-24 LAB — LIPID PANEL
Cholesterol: 162 (ref 0–200)
HDL: 46 (ref 35–70)
LDL Cholesterol: 100
Triglycerides: 80 (ref 40–160)

## 2014-10-24 LAB — TSH: TSH: 0.06 — AB (ref 0.41–5.90)

## 2014-10-24 LAB — CBC AND DIFFERENTIAL
HCT: 47 — AB (ref 36–46)
Hemoglobin: 15.7 (ref 12.0–16.0)
Platelets: 385 (ref 150–399)
WBC: 6.5

## 2014-10-24 LAB — COMPREHENSIVE METABOLIC PANEL
Albumin: 3.9 (ref 3.5–5.0)
Calcium: 9.2 (ref 8.7–10.7)

## 2014-10-24 LAB — CBC: RBC: 5.39 — AB (ref 3.87–5.11)

## 2015-07-06 ENCOUNTER — Other Ambulatory Visit: Payer: Self-pay

## 2015-07-06 DIAGNOSIS — Z1231 Encounter for screening mammogram for malignant neoplasm of breast: Secondary | ICD-10-CM

## 2015-09-14 ENCOUNTER — Ambulatory Visit
Admission: RE | Admit: 2015-09-14 | Discharge: 2015-09-14 | Disposition: A | Discharge status: Home / Self Care | Payer: 59 | Source: Ambulatory Visit

## 2015-09-14 DIAGNOSIS — Z1231 Encounter for screening mammogram for malignant neoplasm of breast: Secondary | ICD-10-CM

## 2016-05-09 DIAGNOSIS — J018 Other acute sinusitis: Secondary | ICD-10-CM | POA: Diagnosis not present

## 2016-05-27 DIAGNOSIS — Z01419 Encounter for gynecological examination (general) (routine) without abnormal findings: Secondary | ICD-10-CM | POA: Diagnosis not present

## 2016-06-01 DIAGNOSIS — Z0000 Encounter for general adult medical examination without abnormal findings: Secondary | ICD-10-CM | POA: Diagnosis not present

## 2016-06-01 DIAGNOSIS — Z1321 Encounter for screening for nutritional disorder: Secondary | ICD-10-CM | POA: Diagnosis not present

## 2016-06-01 DIAGNOSIS — Z131 Encounter for screening for diabetes mellitus: Secondary | ICD-10-CM | POA: Diagnosis not present

## 2016-06-01 DIAGNOSIS — E890 Postprocedural hypothyroidism: Secondary | ICD-10-CM | POA: Diagnosis not present

## 2016-06-01 DIAGNOSIS — Z130 Encounter for screening for diseases of the blood and blood-forming organs and certain disorders involving the immune mechanism: Secondary | ICD-10-CM | POA: Diagnosis not present

## 2016-06-01 DIAGNOSIS — C73 Malignant neoplasm of thyroid gland: Secondary | ICD-10-CM | POA: Diagnosis not present

## 2016-06-01 DIAGNOSIS — Z13220 Encounter for screening for lipoid disorders: Secondary | ICD-10-CM | POA: Diagnosis not present

## 2016-08-22 ENCOUNTER — Other Ambulatory Visit: Payer: Self-pay | Admitting: Obstetrics and Gynecology

## 2016-08-22 DIAGNOSIS — Z1231 Encounter for screening mammogram for malignant neoplasm of breast: Secondary | ICD-10-CM

## 2016-09-14 ENCOUNTER — Ambulatory Visit
Admission: RE | Admit: 2016-09-14 | Discharge: 2016-09-14 | Disposition: A | Discharge status: Home / Self Care | Payer: 59 | Source: Ambulatory Visit | Attending: Obstetrics and Gynecology | Admitting: Obstetrics and Gynecology

## 2016-09-14 DIAGNOSIS — Z1231 Encounter for screening mammogram for malignant neoplasm of breast: Secondary | ICD-10-CM | POA: Diagnosis not present

## 2016-09-26 DIAGNOSIS — H47032 Optic nerve hypoplasia, left eye: Secondary | ICD-10-CM | POA: Diagnosis not present

## 2016-09-26 DIAGNOSIS — H5211 Myopia, right eye: Secondary | ICD-10-CM | POA: Diagnosis not present

## 2016-11-15 DIAGNOSIS — C73 Malignant neoplasm of thyroid gland: Secondary | ICD-10-CM | POA: Diagnosis not present

## 2016-11-22 DIAGNOSIS — C73 Malignant neoplasm of thyroid gland: Secondary | ICD-10-CM | POA: Diagnosis not present

## 2016-11-22 DIAGNOSIS — E890 Postprocedural hypothyroidism: Secondary | ICD-10-CM | POA: Diagnosis not present

## 2017-03-08 DIAGNOSIS — H66002 Acute suppurative otitis media without spontaneous rupture of ear drum, left ear: Secondary | ICD-10-CM | POA: Diagnosis not present

## 2017-03-08 DIAGNOSIS — R030 Elevated blood-pressure reading, without diagnosis of hypertension: Secondary | ICD-10-CM | POA: Diagnosis not present

## 2017-04-13 DIAGNOSIS — Z3200 Encounter for pregnancy test, result unknown: Secondary | ICD-10-CM | POA: Diagnosis not present

## 2017-05-22 DIAGNOSIS — R1013 Epigastric pain: Secondary | ICD-10-CM | POA: Diagnosis not present

## 2017-05-30 DIAGNOSIS — E890 Postprocedural hypothyroidism: Secondary | ICD-10-CM | POA: Diagnosis not present

## 2017-06-12 DIAGNOSIS — Z01419 Encounter for gynecological examination (general) (routine) without abnormal findings: Secondary | ICD-10-CM | POA: Diagnosis not present

## 2017-06-14 DIAGNOSIS — H1031 Unspecified acute conjunctivitis, right eye: Secondary | ICD-10-CM | POA: Diagnosis not present

## 2017-08-01 ENCOUNTER — Other Ambulatory Visit: Payer: Self-pay | Admitting: Obstetrics and Gynecology

## 2017-08-01 DIAGNOSIS — Z1231 Encounter for screening mammogram for malignant neoplasm of breast: Secondary | ICD-10-CM

## 2017-09-15 ENCOUNTER — Ambulatory Visit
Admission: RE | Admit: 2017-09-15 | Discharge: 2017-09-15 | Disposition: A | Discharge status: Home / Self Care | Payer: 59 | Source: Ambulatory Visit | Attending: Obstetrics and Gynecology | Admitting: Obstetrics and Gynecology

## 2017-09-15 DIAGNOSIS — Z1231 Encounter for screening mammogram for malignant neoplasm of breast: Secondary | ICD-10-CM

## 2017-09-26 DIAGNOSIS — H5211 Myopia, right eye: Secondary | ICD-10-CM | POA: Diagnosis not present

## 2017-09-26 DIAGNOSIS — H10411 Chronic giant papillary conjunctivitis, right eye: Secondary | ICD-10-CM | POA: Diagnosis not present

## 2017-09-26 DIAGNOSIS — H47032 Optic nerve hypoplasia, left eye: Secondary | ICD-10-CM | POA: Diagnosis not present

## 2017-11-10 DIAGNOSIS — C73 Malignant neoplasm of thyroid gland: Secondary | ICD-10-CM | POA: Diagnosis not present

## 2017-11-28 DIAGNOSIS — E890 Postprocedural hypothyroidism: Secondary | ICD-10-CM | POA: Diagnosis not present

## 2017-11-28 DIAGNOSIS — Z23 Encounter for immunization: Secondary | ICD-10-CM | POA: Diagnosis not present

## 2017-11-28 DIAGNOSIS — C73 Malignant neoplasm of thyroid gland: Secondary | ICD-10-CM | POA: Diagnosis not present

## 2017-11-28 DIAGNOSIS — Z85850 Personal history of malignant neoplasm of thyroid: Secondary | ICD-10-CM | POA: Diagnosis not present

## 2017-12-22 DIAGNOSIS — R52 Pain, unspecified: Secondary | ICD-10-CM | POA: Diagnosis not present

## 2017-12-22 DIAGNOSIS — M7062 Trochanteric bursitis, left hip: Secondary | ICD-10-CM | POA: Diagnosis not present

## 2018-02-17 DIAGNOSIS — H60392 Other infective otitis externa, left ear: Secondary | ICD-10-CM | POA: Diagnosis not present

## 2018-05-07 DIAGNOSIS — Z3041 Encounter for surveillance of contraceptive pills: Secondary | ICD-10-CM | POA: Diagnosis not present

## 2018-09-21 ENCOUNTER — Other Ambulatory Visit: Payer: Self-pay | Admitting: Obstetrics and Gynecology

## 2018-09-21 DIAGNOSIS — Z1231 Encounter for screening mammogram for malignant neoplasm of breast: Secondary | ICD-10-CM

## 2018-09-24 ENCOUNTER — Other Ambulatory Visit: Payer: Self-pay

## 2018-09-24 ENCOUNTER — Ambulatory Visit
Admission: RE | Admit: 2018-09-24 | Discharge: 2018-09-24 | Disposition: A | Discharge status: Home / Self Care | Payer: 59 | Source: Ambulatory Visit

## 2018-09-24 DIAGNOSIS — Z1231 Encounter for screening mammogram for malignant neoplasm of breast: Secondary | ICD-10-CM

## 2019-01-16 IMAGING — MG DIGITAL SCREENING BILATERAL MAMMOGRAM WITH TOMO AND CAD
8 series · 9 of 24 positions shown · non-contrast
Comparison: Previous exam(s).

CLINICAL DATA: Screening.

EXAM:
DIGITAL SCREENING BILATERAL MAMMOGRAM WITH TOMO AND CAD

[R CC synth-2D]
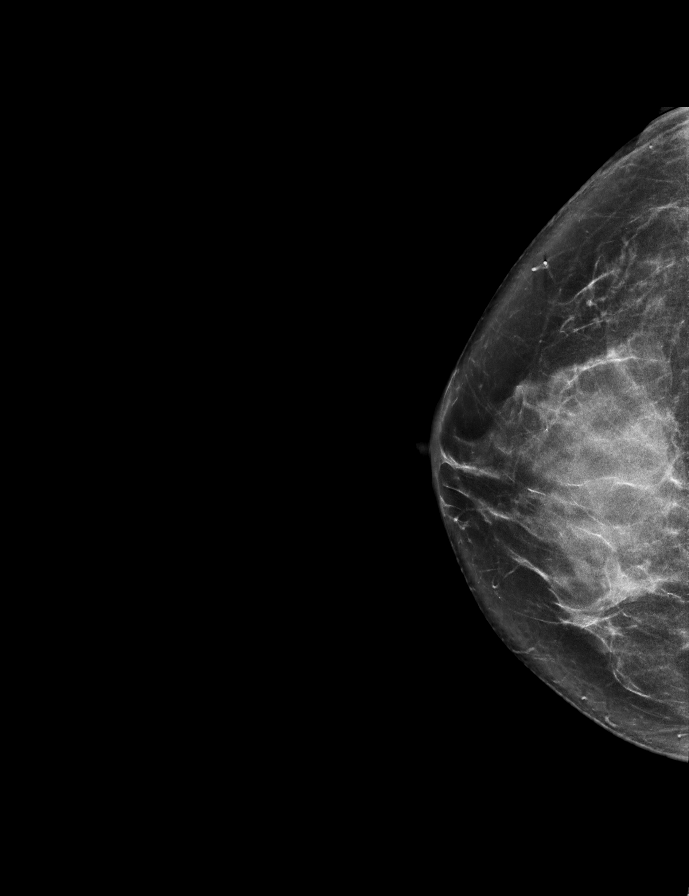

[R MLO synth-2D]
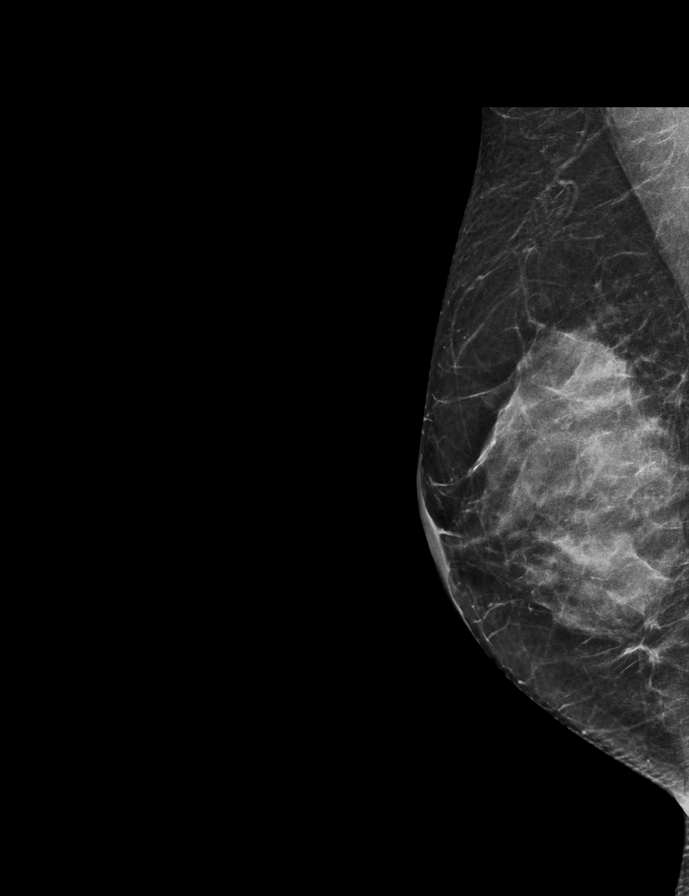

[L CC synth-2D]
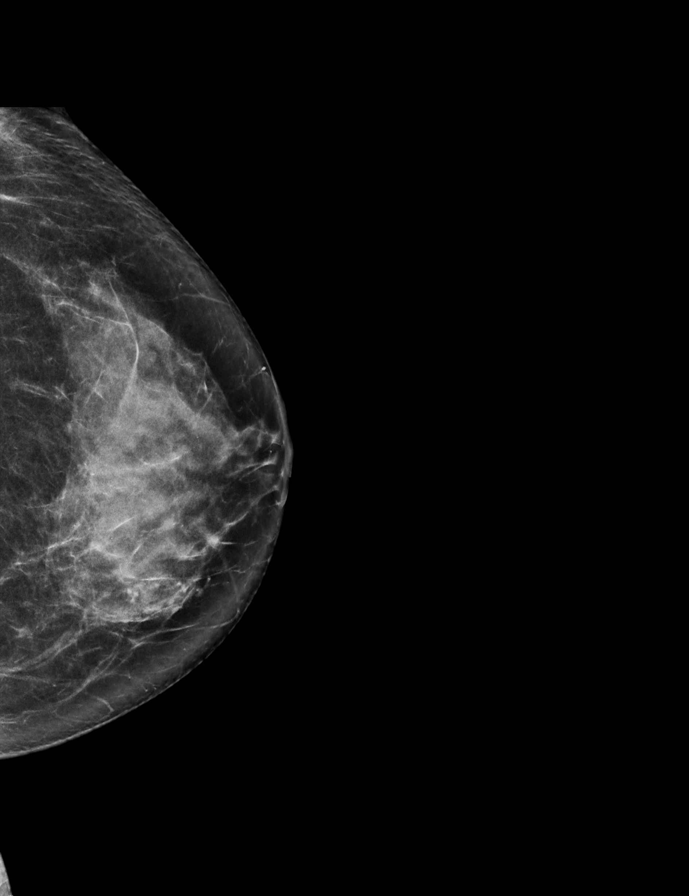

[L MLO synth-2D]
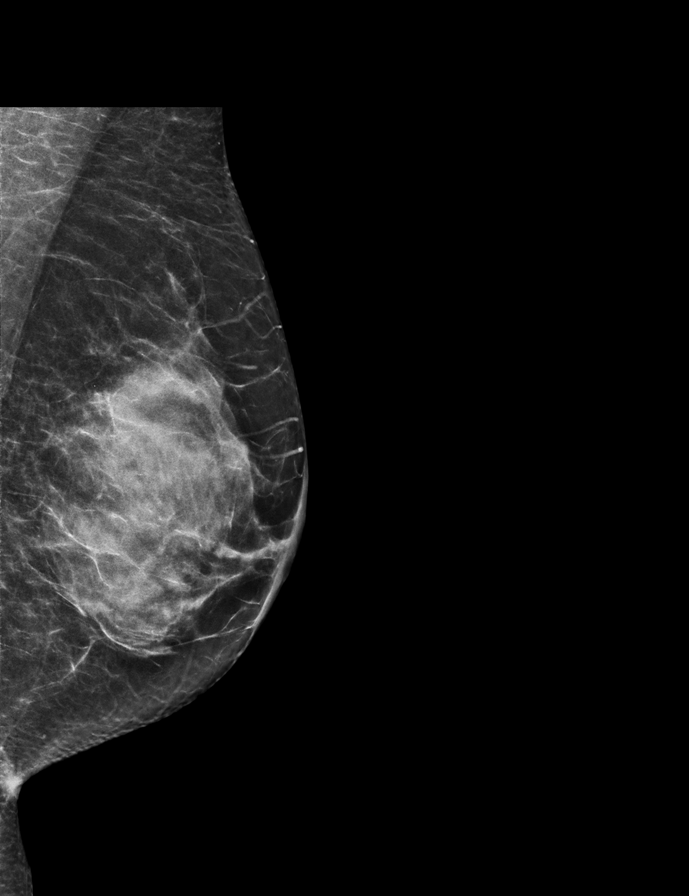

[L CC tomo · 2 of 67 frames shown]
[frame 22/67]
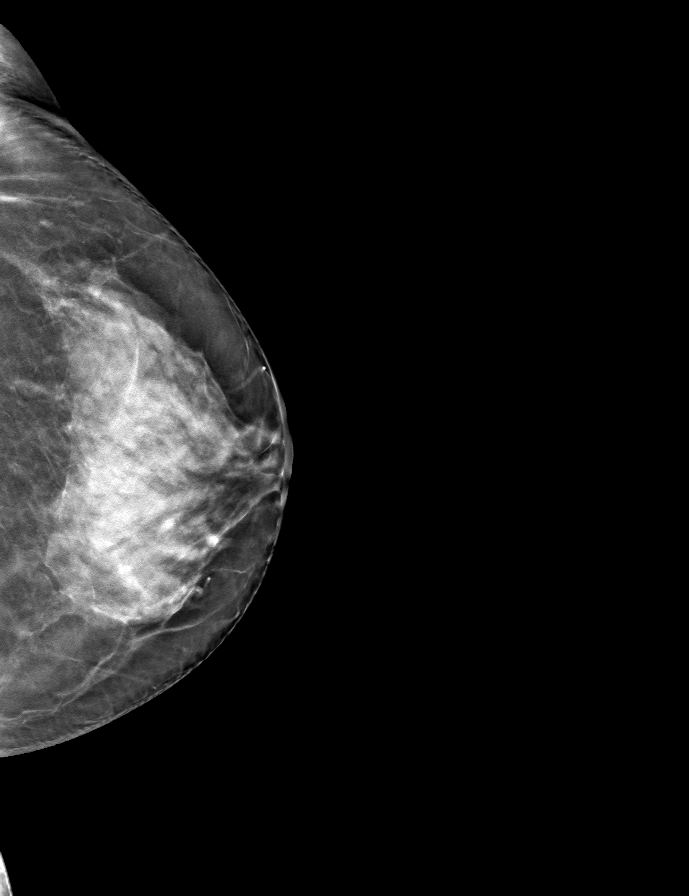
[frame 34/67]
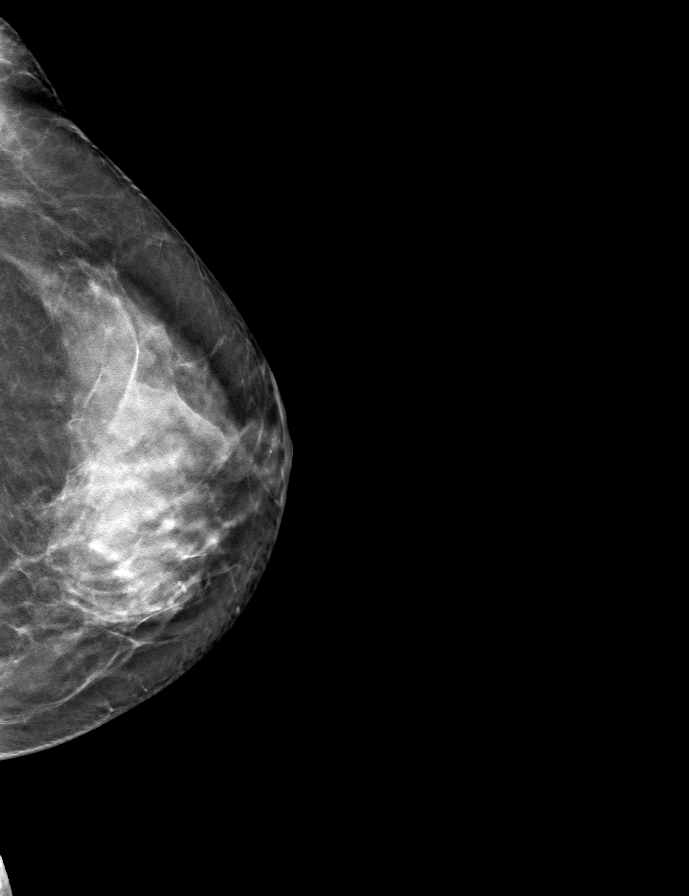

[L MLO tomo · tomo slice 32/63.0]
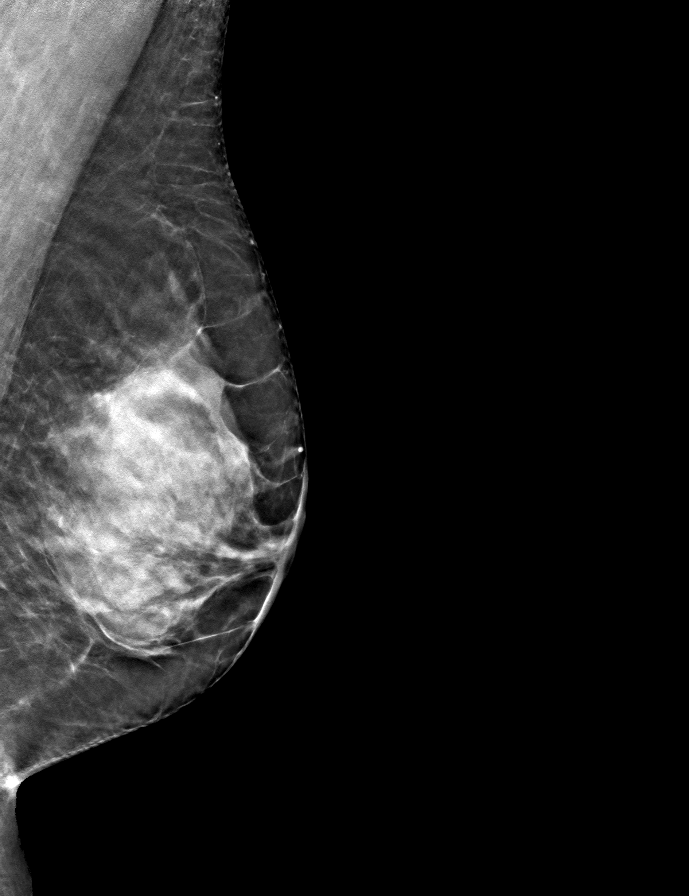

[R MLO tomo · tomo slice 31/61.0]
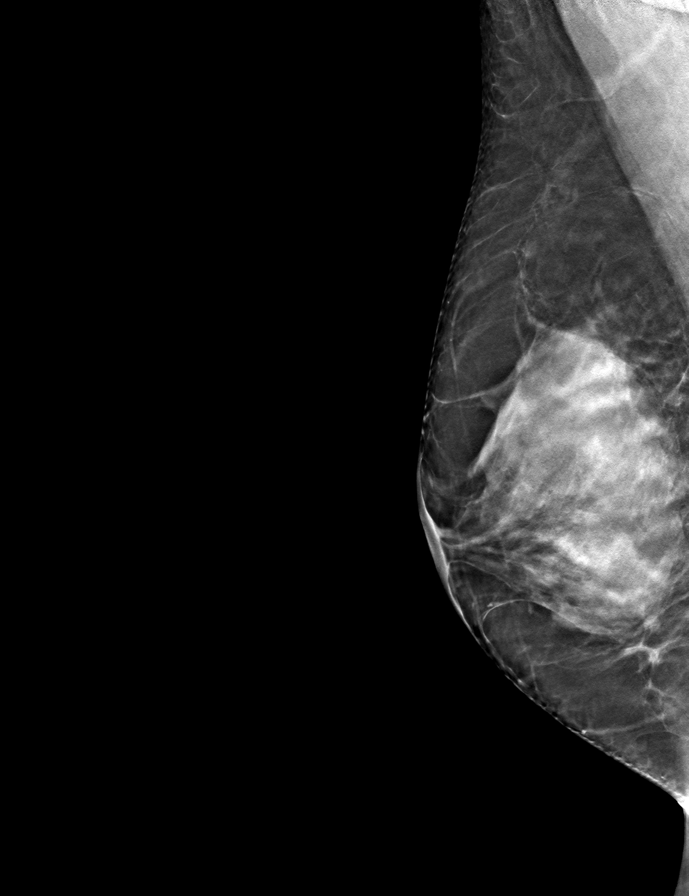

[R CC tomo · tomo slice 35/70.0]
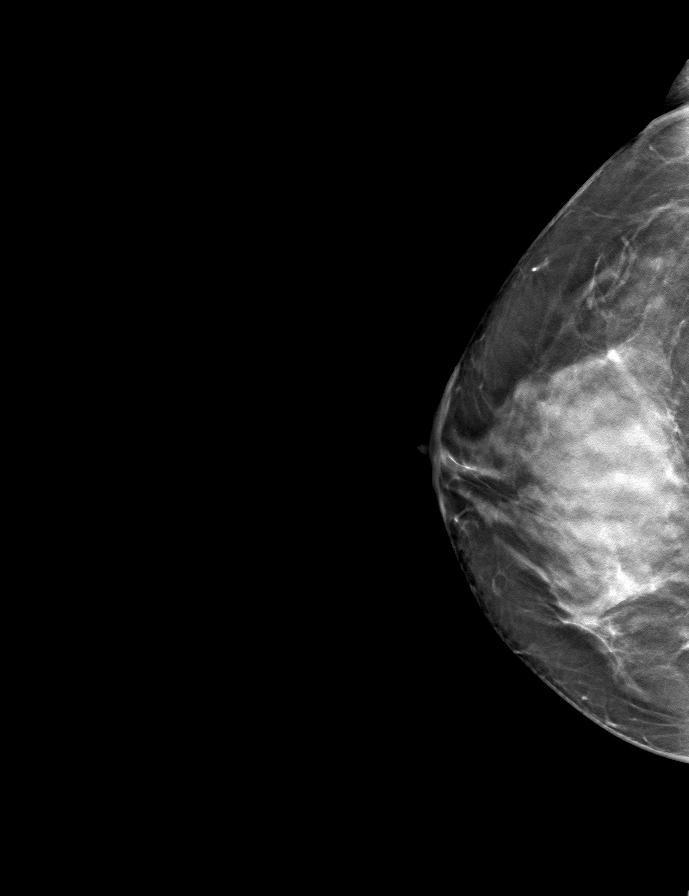

[9 of 24 positions shown; findings below may reference images not displayed]

ACR Breast Density Category d: The breast tissue is extremely dense,
which lowers the sensitivity of mammography.
FINDINGS: There are no findings suspicious for malignancy. Images were
processed with CAD.
IMPRESSION: No mammographic evidence of malignancy. A result letter of this
screening mammogram will be mailed directly to the patient.

RECOMMENDATION:
Screening mammogram in one year. (Code:RA-I-AVB)

BI-RADS CATEGORY  1: Negative.

## 2019-04-20 ENCOUNTER — Ambulatory Visit: Payer: 59

## 2019-08-07 LAB — HM PAP SMEAR: HM Pap smear: NEGATIVE

## 2019-08-07 LAB — RESULTS CONSOLE HPV: CHL HPV: NEGATIVE

## 2019-08-08 ENCOUNTER — Other Ambulatory Visit: Payer: Self-pay | Admitting: Obstetrics and Gynecology

## 2019-08-08 DIAGNOSIS — Z1231 Encounter for screening mammogram for malignant neoplasm of breast: Secondary | ICD-10-CM

## 2019-09-03 LAB — BASIC METABOLIC PANEL
BUN: 18 (ref 4–21)
CO2: 27 — AB (ref 13–22)
Chloride: 104 (ref 99–108)
Creatinine: 0.7 (ref 0.5–1.1)
Glucose: 86
Potassium: 4.6 (ref 3.4–5.3)
Sodium: 138 (ref 137–147)

## 2019-09-03 LAB — HEPATIC FUNCTION PANEL
ALT: 12 (ref 7–35)
AST: 13 (ref 13–35)
Alkaline Phosphatase: 46 (ref 25–125)
Bilirubin, Total: 0.9

## 2019-09-03 LAB — TSH: TSH: 0.05 — AB (ref 0.41–5.90)

## 2019-09-03 LAB — LIPID PANEL
Cholesterol: 207 — AB (ref 0–200)
HDL: 65 (ref 35–70)
LDL Cholesterol: 124
Triglycerides: 102 (ref 40–160)

## 2019-09-25 ENCOUNTER — Ambulatory Visit
Admission: RE | Admit: 2019-09-25 | Discharge: 2019-09-25 | Disposition: A | Discharge status: Home / Self Care | Payer: 59 | Source: Ambulatory Visit | Attending: Obstetrics and Gynecology | Admitting: Obstetrics and Gynecology

## 2019-09-25 ENCOUNTER — Other Ambulatory Visit: Payer: Self-pay

## 2019-09-25 DIAGNOSIS — Z1231 Encounter for screening mammogram for malignant neoplasm of breast: Secondary | ICD-10-CM

## 2020-08-10 ENCOUNTER — Other Ambulatory Visit: Payer: Self-pay | Admitting: Obstetrics and Gynecology

## 2020-08-10 DIAGNOSIS — Z1231 Encounter for screening mammogram for malignant neoplasm of breast: Secondary | ICD-10-CM

## 2020-08-14 ENCOUNTER — Other Ambulatory Visit: Payer: Self-pay

## 2020-08-14 ENCOUNTER — Ambulatory Visit
Admission: RE | Admit: 2020-08-14 | Discharge: 2020-08-14 | Disposition: A | Discharge status: Home / Self Care | Payer: 59 | Source: Ambulatory Visit | Attending: Obstetrics and Gynecology | Admitting: Obstetrics and Gynecology

## 2020-08-14 DIAGNOSIS — Z1231 Encounter for screening mammogram for malignant neoplasm of breast: Secondary | ICD-10-CM

## 2020-09-07 LAB — COMPREHENSIVE METABOLIC PANEL
Albumin: 3.6 (ref 3.5–5.0)
Calcium: 8.5 — AB (ref 8.7–10.7)

## 2020-09-07 LAB — ESTROGENS, TOTAL: Estrogen: 1526

## 2020-09-07 LAB — LIPID PANEL
Cholesterol: 164 (ref 0–200)
HDL: 60 (ref 35–70)
LDL Cholesterol: 89
Triglycerides: 74 (ref 40–160)

## 2020-09-07 LAB — BASIC METABOLIC PANEL
BUN: 17 (ref 4–21)
CO2: 26 — AB (ref 13–22)
Chloride: 103 (ref 99–108)
Creatinine: 0.6 (ref <=1.1)
Glucose: 78
Potassium: 4.6 (ref 3.4–5.3)
Sodium: 135 — AB (ref 137–147)

## 2020-09-07 LAB — FOLLICLE STIMULATING HORMONE: FSH: 2.2

## 2020-09-07 LAB — HEPATIC FUNCTION PANEL
ALT: 11 (ref 7–35)
AST: 14 (ref 13–35)
Alkaline Phosphatase: 68 (ref 25–125)
Bilirubin, Total: 0.7

## 2020-09-07 LAB — VITAMIN D 25 HYDROXY (VIT D DEFICIENCY, FRACTURES): Vit D, 25-Hydroxy: 33

## 2020-09-07 LAB — LUTEINIZING HORMONE: LH: 7.4

## 2020-09-07 LAB — VITAMIN B12: Vitamin B-12: 853

## 2020-09-07 LAB — TSH: TSH: 0.02 — AB (ref 0.41–5.90)

## 2020-09-22 ENCOUNTER — Ambulatory Visit: Payer: 59

## 2020-11-18 ENCOUNTER — Telehealth: Payer: Self-pay | Admitting: Family Medicine

## 2020-11-18 NOTE — Telephone Encounter (Signed)
Patient would like to know if you could see her as a new patient. Her husband, Sara Payne, is your patient. Please advise.

## 2020-11-18 NOTE — Telephone Encounter (Signed)
Only if she is as cool is her hubbie.  I would be happy to see her!!!

## 2020-11-19 NOTE — Telephone Encounter (Signed)
Left voicemail for patient to call us back.

## 2021-01-01 ENCOUNTER — Encounter: Payer: Self-pay | Admitting: Family Medicine

## 2021-01-01 ENCOUNTER — Other Ambulatory Visit: Payer: Self-pay

## 2021-01-01 ENCOUNTER — Ambulatory Visit (INDEPENDENT_AMBULATORY_CARE_PROVIDER_SITE_OTHER): Payer: BC Managed Care – PPO | Admitting: Family Medicine

## 2021-01-01 VITALS — BP 129/79 | HR 90 | Ht 59.0 in | Wt 127.0 lb

## 2021-01-01 DIAGNOSIS — E039 Hypothyroidism, unspecified: Secondary | ICD-10-CM

## 2021-01-01 DIAGNOSIS — M797 Fibromyalgia: Secondary | ICD-10-CM | POA: Insufficient documentation

## 2021-01-01 DIAGNOSIS — K9041 Non-celiac gluten sensitivity: Secondary | ICD-10-CM | POA: Insufficient documentation

## 2021-01-01 DIAGNOSIS — N809 Endometriosis, unspecified: Secondary | ICD-10-CM

## 2021-01-01 DIAGNOSIS — C73 Malignant neoplasm of thyroid gland: Secondary | ICD-10-CM | POA: Diagnosis not present

## 2021-01-01 DIAGNOSIS — K9049 Malabsorption due to intolerance, not elsewhere classified: Secondary | ICD-10-CM | POA: Insufficient documentation

## 2021-01-01 DIAGNOSIS — E559 Vitamin D deficiency, unspecified: Secondary | ICD-10-CM | POA: Insufficient documentation

## 2021-01-01 NOTE — Progress Notes (Signed)
Pt here to Establish care. She has a concern about some recent labs that were done with Dr. Justin Mend particularly her Estrogen level. Pt noticed that this was elevated however Dr. Justin Mend did not feel that there was any concern about the result. Pt is concerned about the elevated lab due to a family history of BrCa (MGM).

## 2021-01-01 NOTE — Progress Notes (Signed)
New Patient Office Visit  Subjective:  Patient ID: Sara Payne, female    DOB: 1971/02/20  Age: 50 y.o. MRN: [id number]  CC:  Chief Complaint  Patient presents with   Establish Care    HPI Sara Payne presents to establish care.  She does have a history of hypothyroidism secondary to postoperative removal of the gland from papillary adenocarcinoma of the thyroid.  She is followed with Dr. Dorian Heckle for years.  She has been on the same dose for quite some time no recent changes.  Unfortunately he will be retiring she has scheduled a follow-up appointment in January with her new provider.  Normally she would have followed up in the fall but she changed jobs this year and has new insurance.  She also had labs checked over the summer which showed an extremely high estrogen level of 1500.  She has not had it repeated yet as was planning to have that rechecked with her thyroid labs with her endocrinologist.  She does have a history of endometriosis.  She had come off of her birth control for short period of time just to see if she was going to start having her period again she is now 29 and was not sure if she might actually be postmenopausal for several months she went without a period but then started bleeding again this summer.  She also she had an allergy to gluten but after some testing with Suzy Bouchard well found that she was actually intolerant to dairy and eggs and had negative testing for gluten enteropathy.  She does have a family history of breast cancer.  Received faxed office visit notes from [facility] on 01/05/2021 so notes were updated today.  Past Medical History:  Diagnosis Date   Anemia    Fibromyalgia    GERD (gastroesophageal reflux disease)    Hashimoto disease 2004   Pregnancy complication    P5K9: 3,26   Thyroid cancer ([place]) 2004    Past Surgical History:  Procedure Laterality Date   LAPAROSCOPY     THYROIDECTOMY  2004   removed lymphnodes 2006     Family History  Problem Relation Age of Onset   Heart disease Father    Hypertension Father    Breast cancer Maternal Grandmother     Social History   Socioeconomic History   Marital status: Married    Spouse name: Not on file   Number of children: 1   Years of education: Not on file   Highest education level: Not on file  Occupational History   Occupation: Social worker at [employer]: [employer]  Tobacco Use   Smoking status: Never   Smokeless tobacco: Not on file  Substance and Sexual Activity   Alcohol use: No   Drug use: No   Sexual activity: Not on file  Other Topics Concern   Not on file  Social History Narrative   Married to [place]   Has a young son, Warden/ranger   Exercises 30 minutes, 3 times a week   On birth control pills   Social Determinants of Health   Financial Resource Strain: Not on file  Food Insecurity: Not on file  Transportation Needs: Not on file  Physical Activity: Not on file  Stress: Not on file  Social Connections: Not on file  Intimate Partner Violence: Not on file    ROS Review of Systems  Constitutional:  Negative for diaphoresis, fever and unexpected weight change.  HENT:  Negative for hearing loss, postnasal drip, sneezing and tinnitus.   Eyes:  Negative for visual disturbance.  Respiratory:  Negative for cough and wheezing.   Cardiovascular:  Negative for chest pain and palpitations.  Genitourinary:  Negative for vaginal bleeding and vaginal discharge.  Musculoskeletal:  Positive for arthralgias.  Neurological:  Negative for headaches.  Hematological:  Negative for adenopathy. Does not bruise/bleed easily.   Objective:   Today's Vitals: BP 129/79   Pulse 90   Ht 4\' 11"  (1.499 m)   Wt 127 lb (57.6 kg)   SpO2 100%   BMI 25.65 kg/m   Physical Exam Constitutional:      Appearance: She is well-developed.  HENT:     Head: Normocephalic and atraumatic.  Cardiovascular:     Rate and Rhythm: Normal rate and regular  rhythm.     Heart sounds: Normal heart sounds.  Pulmonary:     Effort: Pulmonary effort is normal.     Breath sounds: Normal breath sounds.  Skin:    General: Skin is warm and dry.  Neurological:     Mental Status: She is alert and oriented to person, place, and time.  Psychiatric:        Behavior: Behavior normal.    Assessment & Plan:   Problem List Items Addressed This Visit       Endocrine   Papillary adenocarcinoma of thyroid [facility])    Thyroidectomy October 2004.  Lymph node removal in 2006.  Radiation.  Now on thyroid replacement.      Relevant Medications   levothyroxine (SYNTHROID) 112 MCG tablet   Hypothyroidism - Primary    She follows with Dr. Nicoletta Dress but he is retiring so will estab with a new provider in [place] in Jan.  She has scheduled.  She was originally supposed to see him this fall but she switched health insurance is and so has set everything up in January.  She usually also gets a yearly ultrasound.      Relevant Medications   levothyroxine (SYNTHROID) 112 MCG tablet     Other   Endometriosis    Has been on birth control for years.  Currently on Heather.  She did stop it for a period this year just to see if she would actually have her period to make sure she was not postmenopausal.  Initially she did not have a period but this spring and summer she did      Dairy product intolerance    Is an upset stomach and reflux when she eats dairy products.       Colon cancer  - screening is scheduled for the spring.  Once I get her medical records we can see if her other vaccines including Tdap are updated she is not sure the exact date on that.  Outpatient Encounter Medications as of 01/01/2021  Medication Sig   cetirizine (ZYRTEC) 10 MG tablet Take 10 mg by mouth at bedtime.   [name] tablet Take 1 tablet by mouth daily.   levothyroxine (SYNTHROID) 112 MCG tablet Take 112 mcg by mouth daily.   Multiple Vitamin (MULTIVITAMIN) tablet Take 1 tablet by  mouth at bedtime.    Oyster Shell Calcium 500 MG TABS Take 1 tablet by mouth 2 (two) times daily.   [DISCONTINUED] levothyroxine (SYNTHROID, LEVOTHROID) 125 MCG tablet Take 125 mcg by mouth daily before breakfast.   [DISCONTINUED] Norethin Ace-Eth Estrad-FE (MINASTRIN 24 FE PO) Take 1 tablet by mouth at bedtime.   [DISCONTINUED] Olopatadine HCl (  PATANASE) 0.6 % SOLN Place 1 each into the nose 2 (two) times daily.   [DISCONTINUED] ranitidine (ZANTAC) 150 MG tablet Take 150 mg by mouth daily as needed for heartburn.   No facility-administered encounter medications on file as of 01/01/2021.    Follow-up: No follow-ups on file.   Beatrice Lecher, MD

## 2021-01-01 NOTE — Assessment & Plan Note (Signed)
She follows with Dr. Nicoletta Dress but he is retiring so will estab with a new provider in [place] in Jan.  She has scheduled.  She was originally supposed to see him this fall but she switched health insurance is and so has set everything up in January.  She usually also gets a yearly ultrasound.

## 2021-01-01 NOTE — Assessment & Plan Note (Signed)
Has been on birth control for years.  Currently on Sara Payne.  She did stop it for a period this year just to see if she would actually have her period to make sure she was not postmenopausal.  Initially she did not have a period but this spring and summer she did

## 2021-01-01 NOTE — Assessment & Plan Note (Signed)
Is an upset stomach and reflux when she eats dairy products.

## 2021-01-06 NOTE — Assessment & Plan Note (Addendum)
Thyroidectomy October 2004.  Lymph node removal in 2006.  Radiation.  Now on thyroid replacement.

## 2021-03-01 DIAGNOSIS — C73 Malignant neoplasm of thyroid gland: Secondary | ICD-10-CM | POA: Diagnosis not present

## 2021-03-01 DIAGNOSIS — E890 Postprocedural hypothyroidism: Secondary | ICD-10-CM | POA: Diagnosis not present

## 2021-03-02 ENCOUNTER — Encounter: Payer: Self-pay | Admitting: Family Medicine

## 2021-03-08 DIAGNOSIS — D3701 Neoplasm of uncertain behavior of lip: Secondary | ICD-10-CM | POA: Diagnosis not present

## 2021-03-17 DIAGNOSIS — C73 Malignant neoplasm of thyroid gland: Secondary | ICD-10-CM | POA: Diagnosis not present

## 2021-03-22 DIAGNOSIS — Z79890 Hormone replacement therapy (postmenopausal): Secondary | ICD-10-CM | POA: Diagnosis not present

## 2021-03-22 DIAGNOSIS — E890 Postprocedural hypothyroidism: Secondary | ICD-10-CM | POA: Diagnosis not present

## 2021-03-22 DIAGNOSIS — Z85850 Personal history of malignant neoplasm of thyroid: Secondary | ICD-10-CM | POA: Diagnosis not present

## 2021-04-01 ENCOUNTER — Other Ambulatory Visit: Payer: Self-pay

## 2021-04-01 ENCOUNTER — Ambulatory Visit: Payer: BC Managed Care – PPO | Admitting: Family Medicine

## 2021-04-01 ENCOUNTER — Encounter: Payer: Self-pay | Admitting: Family Medicine

## 2021-04-01 VITALS — BP 150/100 | HR 82 | Resp 18 | Ht 59.0 in | Wt 129.0 lb

## 2021-04-01 DIAGNOSIS — J019 Acute sinusitis, unspecified: Secondary | ICD-10-CM | POA: Diagnosis not present

## 2021-04-01 MED ORDER — AMOXICILLIN-POT CLAVULANATE 875-125 MG PO TABS: 1.0000 | ORAL_TABLET | Freq: Two times a day (BID) | ORAL | 0 refills | Status: DC

## 2021-04-01 NOTE — Progress Notes (Signed)
Acute Office Visit  Subjective:    Patient ID: Sara Payne, female    DOB: [birth year], 51 y.o.   MRN: [id number]  Chief Complaint  Patient presents with   Sinusitis    Teeth pain, facial pressure/pain, 2 weeks    HPI Patient is in today for sinus symptoms.  She said she is really had some sinus congestion and postnasal drip on and off since early January.  But she says that it has gotten progressively worse.  She is now having pain over the nasal bridge and frontal sinuses.  She has had ear fullness and was trying to pop her ears on Saturday and had some pain in her right ear but today her left ear is actually hurting and she feels like there is a little fullness and swelling on the left side of her neck.  That is the side she had lymph nodes removed.  She has been taking a decongestant and then some Mucinex but says they make her feel weird so she stopped them.  No fevers or chills.  She did do a COVID test and it was negative.  No GI symptoms.  The sinus pressure is radiating into her teeth on the left side.  Past Medical History:  Diagnosis Date   Anemia    Fibromyalgia    GERD (gastroesophageal reflux disease)    Hashimoto disease 2004   Pregnancy complication    Z6O2: 9,47   Thyroid cancer ([place]) 2004    Past Surgical History:  Procedure Laterality Date   LAPAROSCOPY     THYROIDECTOMY  2004   removed lymphnodes 2006    Family History  Problem Relation Age of Onset   Heart disease Father    Hypertension Father    Breast cancer Maternal Grandmother     Social History   Socioeconomic History   Marital status: Married    Spouse name: Not on file   Number of children: 1   Years of education: Not on file   Highest education level: Not on file  Occupational History   Occupation: Social worker at [employer]: [employer]  Tobacco Use   Smoking status: Never   Smokeless tobacco: Not on file  Substance and Sexual Activity   Alcohol use: No   Drug use: No    Sexual activity: Not on file  Other Topics Concern   Not on file  Social History Narrative   Married to [place]   Has a young son, Warden/ranger   Exercises 30 minutes, 3 times a week   On birth control pills   Social Determinants of Health   Financial Resource Strain: Not on file  Food Insecurity: Not on file  Transportation Needs: Not on file  Physical Activity: Not on file  Stress: Not on file  Social Connections: Not on file  Intimate Partner Violence: Not on file    Outpatient Medications Prior to Visit  Medication Sig Dispense Refill   cetirizine (ZYRTEC) 10 MG tablet Take 10 mg by mouth at bedtime.     [name] tablet Take 1 tablet by mouth daily.     levothyroxine (SYNTHROID) 112 MCG tablet Take 112 mcg by mouth daily.     Multiple Vitamin (MULTIVITAMIN) tablet Take 1 tablet by mouth at bedtime.      Oyster Shell Calcium 500 MG TABS Take 1 tablet by mouth 2 (two) times daily.     No facility-administered medications prior to visit.    Allergies  Allergen Reactions   Sulfa Antibiotics Rash    Review of Systems     Objective:    Physical Exam Constitutional:      Appearance: She is well-developed.  HENT:     Head: Normocephalic and atraumatic.     Right Ear: External ear normal.     Left Ear: External ear normal.     Nose: Nose normal.  Eyes:     Conjunctiva/sclera: Conjunctivae normal.     Pupils: Pupils are equal, round, and reactive to light.  Neck:     Thyroid: No thyromegaly.  Cardiovascular:     Rate and Rhythm: Normal rate and regular rhythm.     Heart sounds: Normal heart sounds.  Pulmonary:     Effort: Pulmonary effort is normal.     Breath sounds: Normal breath sounds. No wheezing.  Musculoskeletal:     Cervical back: Neck supple.  Lymphadenopathy:     Cervical: No cervical adenopathy.  Skin:    General: Skin is warm and dry.  Neurological:     Mental Status: She is alert and oriented to person, place, and time.    BP (!) 150/100     Pulse 82    Resp 18    Ht 4\' 11"  (1.499 m)    Wt 129 lb (58.5 kg)    SpO2 98%    BMI 26.05 kg/m  Wt Readings from Last 3 Encounters:  04/01/21 129 lb (58.5 kg)  01/01/21 127 lb (57.6 kg)  01/18/11 108 lb 6.4 oz (49.2 kg)    Health Maintenance Due  Topic Date Due   HIV Screening  Never done   Hepatitis C Screening  Never done   TETANUS/TDAP  Never done   Zoster Vaccines- Shingrix (1 of 2) Never done   PAP SMEAR-Modifier  05/13/2013   COLONOSCOPY (Pts 45-50yrs Insurance coverage will need to be confirmed)  Never done   COVID-19 Vaccine (3 - Pfizer risk series) 06/10/2019    There are no preventive care reminders to display for this patient.   Lab Results  Component Value Date   TSH 0.02 (A) 09/07/2020   Lab Results  Component Value Date   WBC 6.5 10/24/2014   HGB 15.7 10/24/2014   HCT 47 (A) 10/24/2014   MCV 90.8 03/26/2009   PLT 385 10/24/2014   Lab Results  Component Value Date   NA 135 (A) 09/07/2020   K 4.6 09/07/2020   CO2 26 (A) 09/07/2020   GLUCOSE 72 12/12/2008   BUN 17 09/07/2020   CREATININE 0.6 09/07/2020   BILITOT 1.0 12/12/2008   ALKPHOS 68 09/07/2020   AST 14 09/07/2020   ALT 11 09/07/2020   PROT 7.3 12/12/2008   ALBUMIN 3.6 09/07/2020   CALCIUM 8.5 (A) 09/07/2020   Lab Results  Component Value Date   CHOL 164 09/07/2020   Lab Results  Component Value Date   HDL 60 09/07/2020   Lab Results  Component Value Date   LDLCALC 89 09/07/2020   Lab Results  Component Value Date   TRIG 74 09/07/2020   No results found for: CHOLHDL No results found for: HGBA1C     Assessment & Plan:   Problem List Items Addressed This Visit   None Visit Diagnoses     Acute non-recurrent sinusitis, unspecified location    -  Primary   Relevant Medications   amoxicillin-clavulanate (AUGMENTIN) 875-125 MG tablet      Acute sinusitis-we will go ahead and treat with Augmentin.  If  not better in 1 week then please let us know.  Continue symptomatic care  with nasal saline, humidifier, staying well-hydrated and could consider starting a nasal steroid spray.  Meds ordered this encounter  Medications   amoxicillin-clavulanate (AUGMENTIN) 875-125 MG tablet    Sig: Take 1 tablet by mouth 2 (two) times daily.    Dispense:  14 tablet    Refill:  0     Beatrice Lecher, MD

## 2021-04-09 ENCOUNTER — Encounter: Payer: Self-pay | Admitting: Family Medicine

## 2021-05-18 ENCOUNTER — Encounter: Payer: Self-pay | Admitting: Family Medicine

## 2021-05-18 DIAGNOSIS — Z1211 Encounter for screening for malignant neoplasm of colon: Secondary | ICD-10-CM

## 2021-05-18 NOTE — Telephone Encounter (Signed)
Orders Placed This Encounter  Procedures  . Ambulatory referral to Gastroenterology    Referral Priority:  Routine    Referral Type:  Consultation    Referral Reason:  Specialty Services Required    Number of Visits Requested:  1     

## 2021-05-19 NOTE — Telephone Encounter (Signed)
Sara Payne can you call and make sure that they actually get her scheduled I know they just put them into a queue.  But I want to definitely make sure she gets on the schedule this summer.  Please see her note below.  Lease let her know once we have reached out to them just so that she knows we have placed the referral.  Thank you ?

## 2021-05-24 ENCOUNTER — Encounter: Payer: Self-pay | Admitting: Gastroenterology

## 2021-06-25 ENCOUNTER — Other Ambulatory Visit: Payer: Self-pay | Admitting: Obstetrics and Gynecology

## 2021-06-25 DIAGNOSIS — Z1231 Encounter for screening mammogram for malignant neoplasm of breast: Secondary | ICD-10-CM

## 2021-07-02 ENCOUNTER — Telehealth: Payer: Self-pay | Admitting: Family Medicine

## 2021-07-02 DIAGNOSIS — Z0000 Encounter for general adult medical examination without abnormal findings: Secondary | ICD-10-CM

## 2021-07-02 NOTE — Telephone Encounter (Signed)
Patient is scheduled for physical on 6/12 and would like to get lab work done before. She also asked if her Estrogen can be checked as well.  ?

## 2021-07-05 ENCOUNTER — Ambulatory Visit (AMBULATORY_SURGERY_CENTER): Payer: BC Managed Care – PPO | Admitting: *Deleted

## 2021-07-05 VITALS — Ht 59.0 in | Wt 127.0 lb

## 2021-07-05 DIAGNOSIS — Z1211 Encounter for screening for malignant neoplasm of colon: Secondary | ICD-10-CM

## 2021-07-05 MED ORDER — NA SULFATE-K SULFATE-MG SULF 17.5-3.13-1.6 GM/177ML PO SOLN: 1.0000 | ORAL | 0 refills | Status: DC

## 2021-07-05 NOTE — Progress Notes (Signed)
Patient's pre-visit was done today over the phone with the patient. Name,DOB and address verified. Patient denies any allergies to Eggs and Soy. Patient denies any problems with anesthesia/sedation. Patient is not taking any diet pills or blood thinners. No home Oxygen. Insurance confirmed with patient. ? ?Prep instructions sent to pt's MyChart-pt is aware. Patient understands to call us back with any questions or concerns. Patient is aware of our care-partner policy.  ? ?EMMI education assigned to the patient for the procedure, sent to MyChart.  ? ?The patient is COVID-19 vaccinated.   ?

## 2021-07-06 ENCOUNTER — Encounter: Payer: Self-pay | Admitting: Family Medicine

## 2021-07-06 DIAGNOSIS — Z0000 Encounter for general adult medical examination without abnormal findings: Secondary | ICD-10-CM

## 2021-07-06 DIAGNOSIS — E039 Hypothyroidism, unspecified: Secondary | ICD-10-CM

## 2021-07-06 DIAGNOSIS — C73 Malignant neoplasm of thyroid gland: Secondary | ICD-10-CM

## 2021-07-06 NOTE — Telephone Encounter (Signed)
LVM advising pt that order for labs has been placed ?

## 2021-07-09 ENCOUNTER — Telehealth: Payer: Self-pay | Admitting: Family Medicine

## 2021-07-09 NOTE — Telephone Encounter (Signed)
Cervical and colon cancer screening due

## 2021-07-12 NOTE — Telephone Encounter (Signed)
Medical records release faxed

## 2021-07-12 NOTE — Telephone Encounter (Signed)
Ok to call for last pap report

## 2021-07-15 NOTE — Addendum Note (Signed)
Addended by: Beatrice Lecher D on: 07/15/2021 04:49 PM   Modules accepted: Orders

## 2021-07-15 NOTE — Telephone Encounter (Signed)
Orders Placed This Encounter  Procedures   Estrogens, total   Estradiol   CBC   COMPLETE METABOLIC PANEL WITH GFR   Lipid Panel w/reflex Direct LDL   VITAMIN D 25 Hydroxy (Vit-D Deficiency, Fractures)   B12 and Folate Panel   Fe+TIBC+Fer   TSH

## 2021-07-26 ENCOUNTER — Encounter: Payer: Self-pay | Admitting: Gastroenterology

## 2021-07-26 DIAGNOSIS — E039 Hypothyroidism, unspecified: Secondary | ICD-10-CM | POA: Diagnosis not present

## 2021-07-26 DIAGNOSIS — E559 Vitamin D deficiency, unspecified: Secondary | ICD-10-CM | POA: Diagnosis not present

## 2021-07-26 DIAGNOSIS — E538 Deficiency of other specified B group vitamins: Secondary | ICD-10-CM | POA: Diagnosis not present

## 2021-07-26 DIAGNOSIS — C73 Malignant neoplasm of thyroid gland: Secondary | ICD-10-CM | POA: Diagnosis not present

## 2021-07-26 DIAGNOSIS — Z0000 Encounter for general adult medical examination without abnormal findings: Secondary | ICD-10-CM | POA: Diagnosis not present

## 2021-07-27 ENCOUNTER — Ambulatory Visit (AMBULATORY_SURGERY_CENTER): Payer: BC Managed Care – PPO | Admitting: Gastroenterology

## 2021-07-27 ENCOUNTER — Encounter: Payer: Self-pay | Admitting: Gastroenterology

## 2021-07-27 VITALS — BP 131/81 | HR 79 | Temp 97.8°F | Resp 22 | Ht 59.0 in | Wt 127.0 lb

## 2021-07-27 DIAGNOSIS — Z1211 Encounter for screening for malignant neoplasm of colon: Secondary | ICD-10-CM

## 2021-07-27 MED ORDER — SODIUM CHLORIDE 0.9 % IV SOLN: 500.0000 mL | Freq: Once | INTRAVENOUS | Status: DC

## 2021-07-27 NOTE — Patient Instructions (Signed)
YOU HAD AN ENDOSCOPIC PROCEDURE TODAY AT THE [facility]:   Refer to the procedure report that was given to you for any specific questions about what was found during the examination.  If the procedure report does not answer your questions, please call your gastroenterologist to clarify.  If you requested that your care partner not be given the details of your procedure findings, then the procedure report has been included in a sealed envelope for you to review at your convenience later.  YOU SHOULD EXPECT: Some feelings of bloating in the abdomen. Passage of more gas than usual.  Walking can help get rid of the air that was put into your GI tract during the procedure and reduce the bloating. If you had a lower endoscopy (such as a colonoscopy or flexible sigmoidoscopy) you may notice spotting of blood in your stool or on the toilet paper. If you underwent a bowel prep for your procedure, you may not have a normal bowel movement for a few days.  Please Note:  You might notice some irritation and congestion in your nose or some drainage.  This is from the oxygen used during your procedure.  There is no need for concern and it should clear up in a day or so.  SYMPTOMS TO REPORT IMMEDIATELY:  Following lower endoscopy (colonoscopy or flexible sigmoidoscopy):  Excessive amounts of blood in the stool  Significant tenderness or worsening of abdominal pains  Swelling of the abdomen that is new, acute  Fever of 100F or higher  For urgent or emergent issues, a gastroenterologist can be reached at any hour by calling [phone]. Do not use MyChart messaging for urgent concerns.    DIET:  We do recommend a small meal at first, but then you may proceed to your regular diet.  Drink plenty of fluids but you should avoid alcoholic beverages for 24 hours.  ACTIVITY:  You should plan to take it easy for the rest of today and you should NOT DRIVE or use heavy machinery until tomorrow (because of  the sedation medicines used during the test).    FOLLOW UP: Our staff will call the number listed on your records 24-72 hours following your procedure to check on you and address any questions or concerns that you may have regarding the information given to you following your procedure. If we do not reach you, we will leave a message.  We will attempt to reach you two times.  During this call, we will ask if you have developed any symptoms of COVID 19. If you develop any symptoms (ie: fever, flu-like symptoms, shortness of breath, cough etc.) before then, please call [phone].  If you test positive for Covid 19 in the 2 weeks post procedure, please call and report this information to us.    If any biopsies were taken you will be contacted by phone or by letter within the next 1-3 weeks.  Please call us at [phone] if you have not heard about the biopsies in 3 weeks.    SIGNATURES/CONFIDENTIALITY: You and/or your care partner have signed paperwork which will be entered into your electronic medical record.  These signatures attest to the fact that that the information above on your After Visit Summary has been reviewed and is understood.  Full responsibility of the confidentiality of this discharge information lies with you and/or your care-partner.  

## 2021-07-27 NOTE — Progress Notes (Signed)
VSS, transported to PACU °

## 2021-07-27 NOTE — Op Note (Signed)
[facility] Patient Name: Sara Payne Procedure Date: 07/27/2021 9:27 AM MRN: [id number] Endoscopist: Ladene Artist , MD Age: 51 Referring MD:  Date of Birth: Sep 01, 1970 Gender: Female Account #: [id number] Procedure:                Colonoscopy Indications:              Screening for colorectal malignant neoplasm Medicines:                Monitored Anesthesia Care Procedure:                Pre-Anesthesia Assessment:                           - Prior to the procedure, a History and Physical                            was performed, and patient medications and                            allergies were reviewed. The patient's tolerance of                            previous anesthesia was also reviewed. The risks                            and benefits of the procedure and the sedation                            options and risks were discussed with the patient.                            All questions were answered, and informed consent                            was obtained. Prior Anticoagulants: The patient has                            taken no previous anticoagulant or antiplatelet                            agents. ASA Grade Assessment: II - A patient with                            mild systemic disease. After reviewing the risks                            and benefits, the patient was deemed in                            satisfactory condition to undergo the procedure.                           After obtaining informed consent, the colonoscope  was passed under direct vision. Throughout the                            procedure, the patient's blood pressure, pulse, and                            oxygen saturations were monitored continuously. The                            Olympus CF-HQ190L [id number]) Colonoscope was                            introduced through the anus and advanced to the the                            cecum, identified by  appendiceal orifice and                            ileocecal valve. The ileocecal valve, appendiceal                            orifice, and rectum were photographed. The quality                            of the bowel preparation was excellent. The                            colonoscopy was performed without difficulty. The                            patient tolerated the procedure well. Scope In: 9:42:53 AM Scope Out: 9:56:10 AM Scope Withdrawal Time: 0 hours 10 minutes 42 seconds  Total Procedure Duration: 0 hours 13 minutes 17 seconds  Findings:                 The perianal and digital rectal examinations were                            normal.                           The entire examined colon appeared normal on direct                            and retroflexion views. Complications:            No immediate complications. Estimated blood loss:                            None. Estimated Blood Loss:     Estimated blood loss: none. Impression:               - The entire examined colon is normal on direct and                            retroflexion views.                           -  No specimens collected. Recommendation:           - Repeat colonoscopy in 10 years for screening                            purposes.                           - Patient has a contact number available for                            emergencies. The signs and symptoms of potential                            delayed complications were discussed with the                            patient. Return to normal activities tomorrow.                            Written discharge instructions were provided to the                            patient.                           - Resume previous diet.                           - Continue present medications. Ladene Artist, MD 07/27/2021 9:59:32 AM This report has been signed electronically.

## 2021-07-27 NOTE — Progress Notes (Signed)
History & Physical  Primary Care Physician:  Sara Marry, MD Primary Gastroenterologist: Sara Edward, MD  CHIEF COMPLAINT:  CRC screening   HPI: Sara Payne is a 51 y.o. female, average risk CRC screening, here for colonoscopy.   Past Medical History:  Diagnosis Date   Anemia    Fibromyalgia    GERD (gastroesophageal reflux disease)    Hashimoto disease 02/21/2002   Heart murmur    Post-operative nausea and vomiting    Pregnancy complication    V8L3: 8,10   Thyroid cancer ([place]) 02/21/2002    Past Surgical History:  Procedure Laterality Date   LAPAROSCOPY     THYROIDECTOMY  2004   removed lymphnodes 2006    Prior to Admission medications   Medication Sig Start Date End Date Taking? Authorizing Provider  Calcium Carb-Cholecalciferol (CALCIUM/VITAMIN D PO) Take by mouth.   Yes [provider]  cetirizine (ZYRTEC) 10 MG tablet Take 10 mg by mouth at bedtime.   Yes [provider]  fexofenadine (ALLEGRA) 180 MG tablet Take 180 mg by mouth daily.   Yes [provider]  Sara Payne 0.35 MG tablet Take 1 tablet by mouth daily. 12/16/20  Yes [provider]  LEVOXYL 125 MCG tablet Take 125 mcg by mouth daily. 07/04/21  Yes [provider]  Multiple Vitamin (MULTIVITAMIN) tablet Take 1 tablet by mouth at bedtime.    Yes [provider]    Current Outpatient Medications  Medication Sig Dispense Refill   Calcium Carb-Cholecalciferol (CALCIUM/VITAMIN D PO) Take by mouth.     cetirizine (ZYRTEC) 10 MG tablet Take 10 mg by mouth at bedtime.     fexofenadine (ALLEGRA) 180 MG tablet Take 180 mg by mouth daily.     Sara Payne 0.35 MG tablet Take 1 tablet by mouth daily.     LEVOXYL 125 MCG tablet Take 125 mcg by mouth daily.     Multiple Vitamin (MULTIVITAMIN) tablet Take 1 tablet by mouth at bedtime.      Current Facility-Administered Medications  Medication Dose Route Frequency Provider Last Rate Last Admin   0.9 %   sodium chloride infusion  500 mL Intravenous Once Sara Artist, MD        Allergies as of 07/27/2021 - Review Complete 07/27/2021  Allergen Reaction Noted   Sulfa antibiotics Rash 01/01/2021    Family History  Problem Relation Age of Onset   Heart disease Father    Hypertension Father    Breast cancer Maternal Grandmother    Colon cancer Neg Hx    Esophageal cancer Neg Hx    Stomach cancer Neg Hx    Rectal cancer Neg Hx     Social History   Socioeconomic History   Marital status: Married    Spouse name: Not on file   Number of children: 1   Years of education: Not on file   Highest education level: Not on file  Occupational History   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  Tobacco Use   Smoking status: Never   Smokeless tobacco: Not on file  Vaping Use   Vaping Use: Never used  Substance and Sexual Activity   Alcohol use: No   Drug use: No   Sexual activity: Not on file  Other Topics Concern   Not on file  Social History Narrative   Married to Sara Payne   Has a young son, Sara Payne   Exercises 30 minutes, 3 times a week   On  birth control pills   Social Determinants of Health   Financial Resource Strain: Not on file  Food Insecurity: Not on file  Transportation Needs: Not on file  Physical Activity: Not on file  Stress: Not on file  Social Connections: Not on file  Intimate Partner Violence: Not on file    Review of Systems:  All systems reviewed an negative except where noted in HPI.  Gen: Denies any fever, chills, sweats, anorexia, fatigue, weakness, malaise, weight loss, and sleep disorder CV: Denies chest pain, angina, palpitations, syncope, orthopnea, PND, peripheral edema, and claudication. Resp: Denies dyspnea at rest, dyspnea with exercise, cough, sputum, wheezing, coughing up blood, and pleurisy. GI: Denies vomiting blood, jaundice, and fecal incontinence.   Denies dysphagia or odynophagia. GU : Denies urinary burning, blood in  urine, urinary frequency, urinary hesitancy, nocturnal urination, and urinary incontinence. MS: Denies joint pain, limitation of movement, and swelling, stiffness, low back pain, extremity pain. Denies muscle weakness, cramps, atrophy.  Derm: Denies rash, itching, dry skin, hives, moles, warts, or unhealing ulcers.  Psych: Denies depression, anxiety, memory loss, suicidal ideation, hallucinations, paranoia, and confusion. Heme: Denies bruising, bleeding, and enlarged lymph nodes. Neuro:  Denies any headaches, dizziness, paresthesias. Endo:  Denies any problems with DM, thyroid, adrenal function.   Physical Exam: General:  Alert, well-developed, in NAD Head:  Normocephalic and atraumatic. Eyes:  Sclera clear, no icterus.   Conjunctiva pink. Ears:  Normal auditory acuity. Mouth:  No deformity or lesions.  Neck:  Supple; no masses . Lungs:  Clear throughout to auscultation.   No wheezes, crackles, or rhonchi. No acute distress. Heart:  Regular rate and rhythm; no murmurs. Abdomen:  Soft, nondistended, nontender. No masses, hepatomegaly. No obvious masses.  Normal bowel .    Rectal:  Deferred   Msk:  Symmetrical without gross deformities.. Pulses:  Normal pulses noted. Extremities:  Without edema. Neurologic:  Alert and  oriented x4;  grossly normal neurologically. Skin:  Intact without significant lesions or rashes. Cervical Nodes:  No significant cervical adenopathy. Psych:  Alert and cooperative. Normal mood and affect.  Lab Results: Recent Labs    07/26/21 0000  WBC 7.9  HGB 15.8*  HCT 47.8*  PLT 357   BMET Recent Labs    07/26/21 0000  NA 141  K 5.0  CL 105  CO2 24  GLUCOSE 80  BUN 22  CREATININE 0.68  CALCIUM 9.4   LFT Recent Labs    07/26/21 0000  PROT 7.4  AST 17  ALT 14  BILITOT 1.1    Impression / Plan:   Average risk CRC screening with colonoscopy.   Sara Payne. Fuller Plan  07/27/2021, 9:28 AM See Sara Payne, [facility], to contact our on call  provider

## 2021-07-27 NOTE — Progress Notes (Signed)
Pt's states no medical or surgical changes since previsit or office visit. 

## 2021-07-28 ENCOUNTER — Telehealth: Payer: Self-pay | Admitting: *Deleted

## 2021-07-28 NOTE — Progress Notes (Signed)
Hi Alie, hemoglobin is on the higher end of normal but it is okay.  Sometimes if you are little dry on the blood work to it will elevate that number just slightly.  LDL cholesterol is just mildly elevated.  You have a fantastic HDL cholesterol number.  Thyroid looks okay based on your notes from Dr. Denton Lank.  Estradiol is low consistent with post menopause.  B12 looks great.  Folate looks good.  Vitamin D also looks good in fact it is up compared to 10 months ago.  Metabolic panel and iron panel look great.

## 2021-07-28 NOTE — Telephone Encounter (Signed)
  Follow up Call-     07/27/2021    8:44 AM  Call back number  Post procedure Call Back phone  # [phone]  Permission to leave phone message Yes     Patient questions:  Do you have a fever, pain , or abdominal swelling? No. Pain Score  0 *  Have you tolerated food without any problems? Yes.    Have you been able to return to your normal activities? Yes.    Do you have any questions about your discharge instructions: Diet   No. Medications  No. Follow up visit  No.  Do you have questions or concerns about your Care? No.  Actions: * If pain score is 4 or above: No action needed, pain <4.

## 2021-07-30 DIAGNOSIS — Z6826 Body mass index (BMI) 26.0-26.9, adult: Secondary | ICD-10-CM | POA: Diagnosis not present

## 2021-07-30 DIAGNOSIS — Z01419 Encounter for gynecological examination (general) (routine) without abnormal findings: Secondary | ICD-10-CM | POA: Diagnosis not present

## 2021-07-31 LAB — ESTRADIOL: Estradiol: <15 pg/mL

## 2021-07-31 LAB — COMPLETE METABOLIC PANEL WITH GFR
AG Ratio: 1.4 (calc) (ref 1.0–2.5)
ALT: 14 U/L (ref 6–29)
AST: 17 U/L (ref 10–35)
Albumin: 4.3 g/dL (ref 3.6–5.1)
Alkaline phosphatase (APISO): 81 U/L (ref 37–153)
BUN: 22 mg/dL (ref 7–25)
CO2: 24 mmol/L (ref 20–32)
Calcium: 9.4 mg/dL (ref 8.6–10.4)
Chloride: 105 mmol/L (ref 98–110)
Creat: 0.68 mg/dL (ref 0.50–1.03)
Globulin: 3.1 g/dL (calc) (ref 1.9–3.7)
Glucose, Bld: 80 mg/dL (ref 65–99)
Potassium: 5 mmol/L (ref 3.5–5.3)
Sodium: 141 mmol/L (ref 135–146)
Total Bilirubin: 1.1 mg/dL (ref 0.2–1.2)
Total Protein: 7.4 g/dL (ref 6.1–8.1)
eGFR: 106 mL/min/{1.73_m2} (ref >=60)

## 2021-07-31 LAB — LIPID PANEL W/REFLEX DIRECT LDL
Cholesterol: 198 mg/dL (ref <200)
HDL: 67 mg/dL (ref >=50)
LDL Cholesterol (Calc): 114 mg/dL (calc) — ABNORMAL HIGH
Non-HDL Cholesterol (Calc): 131 mg/dL (calc) — ABNORMAL HIGH (ref <130)
Total CHOL/HDL Ratio: 3 (calc) (ref <5.0)
Triglycerides: 76 mg/dL (ref <150)

## 2021-07-31 LAB — CBC
HCT: 47.8 % — ABNORMAL HIGH (ref 35.0–45.0)
Hemoglobin: 15.8 g/dL — ABNORMAL HIGH (ref 11.7–15.5)
MCH: 30 pg (ref 27.0–33.0)
MCHC: 33.1 g/dL (ref 32.0–36.0)
MCV: 90.9 fL (ref 80.0–100.0)
MPV: 10 fL (ref 7.5–12.5)
Platelets: 357 10*3/uL (ref 140–400)
RBC: 5.26 10*6/uL — ABNORMAL HIGH (ref 3.80–5.10)
RDW: 11.8 % (ref 11.0–15.0)
WBC: 7.9 10*3/uL (ref 3.8–10.8)

## 2021-07-31 LAB — IRON,TIBC AND FERRITIN PANEL
%SAT: 33 % (calc) (ref 16–45)
Ferritin: 72 ng/mL (ref 16–232)
Iron: 117 ug/dL (ref 45–160)
TIBC: 353 mcg/dL (calc) (ref 250–450)

## 2021-07-31 LAB — VITAMIN D 25 HYDROXY (VIT D DEFICIENCY, FRACTURES): Vit D, 25-Hydroxy: 44 ng/mL (ref 30–100)

## 2021-07-31 LAB — B12 AND FOLATE PANEL
Folate: >24 ng/mL
Vitamin B-12: 799 pg/mL (ref 200–1100)

## 2021-07-31 LAB — TSH: TSH: 0.01 mIU/L — ABNORMAL LOW

## 2021-07-31 LAB — ESTROGENS, TOTAL: Estrogen: 89.3 pg/mL

## 2021-08-02 ENCOUNTER — Ambulatory Visit (INDEPENDENT_AMBULATORY_CARE_PROVIDER_SITE_OTHER): Payer: BC Managed Care – PPO | Admitting: Family Medicine

## 2021-08-02 ENCOUNTER — Encounter: Payer: Self-pay | Admitting: Family Medicine

## 2021-08-02 VITALS — BP 146/87 | HR 84 | Resp 16 | Ht 59.0 in | Wt 130.0 lb

## 2021-08-02 DIAGNOSIS — Z0000 Encounter for general adult medical examination without abnormal findings: Secondary | ICD-10-CM | POA: Diagnosis not present

## 2021-08-02 DIAGNOSIS — Z23 Encounter for immunization: Secondary | ICD-10-CM

## 2021-08-02 NOTE — Progress Notes (Signed)
Complete physical exam  Patient: Sara Payne   DOB: Sep 15, 1970   51 y.o. Female  MRN: [id number]  Subjective:    Chief Complaint  Patient presents with   Annual Exam    Not fasting     Sara Payne is a 51 y.o. female who presents today for a complete physical exam. She reports consuming a general diet.  Exercise 3 days per week, planning on adding in weights this summer.  She generally feels well. She reports sleeping well. She does not have additional problems to discuss today.   Saw her GYN last year.  Due for her next Pap smear next year.  Already had her labs done.  She did not have any specific questions today.  She just completed her colonoscopy.  Repeat in 10 years.  Most recent fall risk assessment:    08/02/2021    1:31 PM  [place] in the past year? 0  Number falls in past yr: 0  Injury with Fall? 0  Risk for fall due to : No Fall Risks  Follow up Falls prevention discussed;Falls evaluation completed     Most recent depression screenings:    08/02/2021    1:31 PM 04/01/2021    4:28 PM  PHQ 2/9 Scores  PHQ - 2 Score 0 0      Past Surgical History:  Procedure Laterality Date   LAPAROSCOPY     THYROIDECTOMY  2004   removed lymphnodes 2006   Social History   Tobacco Use   Smoking status: Never  Vaping Use   Vaping Use: Never used  Substance Use Topics   Alcohol use: No   Drug use: No   Family History  Problem Relation Age of Onset   Healthy Mother    Heart disease Father    Hypertension Father    Asthma Sister    Hypertension Brother    Breast cancer Maternal Grandmother    Colon cancer Neg Hx    Esophageal cancer Neg Hx    Stomach cancer Neg Hx    Rectal cancer Neg Hx       Patient Care Team: Hali Marry, MD as PCP - General (Family Medicine) Armandina Stammer, DO as Consulting Physician (Obstetrics and Gynecology)   Outpatient Medications Prior to Visit  Medication Sig   Calcium Carb-Cholecalciferol  (CALCIUM/VITAMIN D PO) Take by mouth.   cetirizine (ZYRTEC) 10 MG tablet Take 10 mg by mouth at bedtime.   fexofenadine (ALLEGRA) 180 MG tablet Take 180 mg by mouth daily.   [name] tablet Take 1 tablet by mouth daily.   LEVOXYL 125 MCG tablet Take 125 mcg by mouth daily.   Multiple Vitamin (MULTIVITAMIN) tablet Take 1 tablet by mouth at bedtime.    No facility-administered medications prior to visit.    ROS        Objective:     BP (!) 146/87   Pulse 84   Resp 16   Ht '4\' 11"'$  (1.499 m)   Wt 130 lb (59 kg)   SpO2 99%   BMI 26.26 kg/m     Physical Exam Vitals and nursing note reviewed.  Constitutional:      Appearance: Normal appearance. She is well-developed.  HENT:     Head: Normocephalic and atraumatic.     Right Ear: Tympanic membrane, ear canal and external ear normal.     Left Ear: Tympanic membrane, ear canal and external ear normal.  Nose: Nose normal.     Mouth/Throat:     Mouth: Mucous membranes are moist.     Pharynx: Oropharynx is clear. No posterior oropharyngeal erythema.  Eyes:     Conjunctiva/sclera: Conjunctivae normal.     Pupils: Pupils are equal, round, and reactive to light.  Neck:     Thyroid: No thyromegaly.  Cardiovascular:     Rate and Rhythm: Normal rate and regular rhythm.     Heart sounds: Normal heart sounds.  Pulmonary:     Effort: Pulmonary effort is normal.     Breath sounds: Normal breath sounds. No wheezing.  Abdominal:     General: Bowel sounds are normal. There is no distension.     Tenderness: There is no abdominal tenderness.  Musculoskeletal:     Cervical back: Neck supple.  Lymphadenopathy:     Cervical: No cervical adenopathy.  Skin:    General: Skin is warm and dry.  Neurological:     General: No focal deficit present.     Mental Status: She is alert and oriented to person, place, and time.  Psychiatric:        Mood and Affect: Mood normal.        Behavior: Behavior normal.      No results found  for any visits on 08/02/21.     Assessment & Plan:    Routine Health Maintenance and Physical Exam  Immunization History  Administered Date(s) Administered   Influenza Split 11/15/2011, 11/26/2013, 12/02/2014   Influenza, Quadrivalent, Recombinant, Inj, Pf 12/18/2020   Influenza,inj,Quad PF,6+ Mos 11/28/2017, 12/04/2018   PFIZER(Purple Top)SARS-COV-2 Vaccination 04/19/2019, 05/13/2019   Pfizer Covid-19 Vaccine Bivalent Booster 38yr & up 01/12/2021   Tdap 08/02/2021   Zoster Recombinat (Shingrix) 08/02/2021    Health Maintenance  Topic Date Due   HIV Screening  Never done   Hepatitis C Screening  Never done   COVID-19 Vaccine (4 - Booster for [place]) 08/18/2021 (Originally 03/09/2021)   INFLUENZA VACCINE  09/21/2021   Zoster Vaccines- Shingrix (2 of 2) 09/27/2021   PAP SMEAR-Modifier  08/07/2022   MAMMOGRAM  08/15/2022   COLONOSCOPY (Pts 45-473yrInsurance coverage will need to be confirmed)  07/28/2031   TETANUS/TDAP  08/03/2031   Pneumococcal Vaccine [id number] old  Aged Out   HPV VACCINES  Aged Out    Discussed health benefits of physical activity, and encouraged her to engage in regular exercise appropriate for her age and condition.  Problem List Items Addressed This Visit   None Visit Diagnoses     Wellness examination    -  Primary   Immunization due       Relevant Orders   Tdap vaccine greater than or equal to 7yo IM (Completed)   Varicella-zoster vaccine IM (Shingrix) (Completed)      Return in about 6 months (around 02/01/2022).   Keep up a regular exercise program and make sure you are eating a healthy diet Try to eat 4 servings of dairy a day, or if you are lactose intolerant take a calcium with vitamin D daily.  Your vaccines are up to date.    CaBeatrice LecherMD

## 2021-08-02 NOTE — Patient Instructions (Signed)
2 weeks with nurse for bp check.

## 2021-08-06 DIAGNOSIS — H47032 Optic nerve hypoplasia, left eye: Secondary | ICD-10-CM | POA: Diagnosis not present

## 2021-08-06 DIAGNOSIS — H5211 Myopia, right eye: Secondary | ICD-10-CM | POA: Diagnosis not present

## 2021-08-16 ENCOUNTER — Ambulatory Visit
Admission: RE | Admit: 2021-08-16 | Discharge: 2021-08-16 | Disposition: A | Discharge status: Home / Self Care | Payer: BC Managed Care – PPO | Source: Ambulatory Visit | Attending: Obstetrics and Gynecology | Admitting: Obstetrics and Gynecology

## 2021-08-16 ENCOUNTER — Ambulatory Visit (INDEPENDENT_AMBULATORY_CARE_PROVIDER_SITE_OTHER): Payer: BC Managed Care – PPO | Admitting: Family Medicine

## 2021-08-16 ENCOUNTER — Ambulatory Visit: Payer: BC Managed Care – PPO

## 2021-08-16 VITALS — BP 134/72 | HR 82

## 2021-08-16 DIAGNOSIS — R030 Elevated blood-pressure reading, without diagnosis of hypertension: Secondary | ICD-10-CM | POA: Diagnosis not present

## 2021-08-16 DIAGNOSIS — Z1231 Encounter for screening mammogram for malignant neoplasm of breast: Secondary | ICD-10-CM

## 2021-09-02 ENCOUNTER — Encounter: Payer: Self-pay | Admitting: Family Medicine

## 2021-09-07 DIAGNOSIS — D3701 Neoplasm of uncertain behavior of lip: Secondary | ICD-10-CM | POA: Diagnosis not present

## 2021-09-07 DIAGNOSIS — L249 Irritant contact dermatitis, unspecified cause: Secondary | ICD-10-CM | POA: Diagnosis not present

## 2021-09-14 ENCOUNTER — Encounter: Payer: Self-pay | Admitting: Family Medicine

## 2021-10-18 ENCOUNTER — Ambulatory Visit (INDEPENDENT_AMBULATORY_CARE_PROVIDER_SITE_OTHER): Payer: BC Managed Care – PPO | Admitting: Family Medicine

## 2021-10-18 VITALS — BP 144/86 | HR 88 | Temp 99.6°F | Ht 59.0 in | Wt 134.1 lb

## 2021-10-18 DIAGNOSIS — R030 Elevated blood-pressure reading, without diagnosis of hypertension: Secondary | ICD-10-CM | POA: Diagnosis not present

## 2021-10-18 MED ORDER — LOSARTAN POTASSIUM 25 MG PO TABS: 25.0000 mg | ORAL_TABLET | Freq: Every day | ORAL | 0 refills | Status: DC

## 2021-10-18 NOTE — Progress Notes (Signed)
Patient is here for blood pressure check. Denies trouble sleeping, palpitations, dizziness, lightheadedness, blurry vision, chest pain, shortness of breath, headaches and/or medication problems.   Patient's blood pressure was out of goal range. Patient sat for 15 minutes. Blood pressure recheck was still out of range. Provider was notified of current blood pressure readings. Per provider, patient is to start Losartan 25 mg. Rx pended.   Per patient's request, a manual blood pressure check was completed (144/86). Patient advised to check blood pressure at home.  Patient informed to schedule next [facility] appointment within 2 to 3 weeks.

## 2021-10-18 NOTE — Progress Notes (Signed)
Several occasions were blood pressure has been elevated greater than systolic of 034.  Elevated again today.  Commend starting losartan 25 mg.  Follow-up in a couple of weeks to recheck pressure on medication.  Will need a BMP at that time as well.

## 2021-11-09 ENCOUNTER — Encounter: Payer: Self-pay | Admitting: Family Medicine

## 2021-11-09 DIAGNOSIS — R030 Elevated blood-pressure reading, without diagnosis of hypertension: Secondary | ICD-10-CM

## 2021-11-10 NOTE — Telephone Encounter (Signed)
That is fine 

## 2021-11-17 ENCOUNTER — Other Ambulatory Visit: Payer: Self-pay

## 2021-11-17 DIAGNOSIS — R030 Elevated blood-pressure reading, without diagnosis of hypertension: Secondary | ICD-10-CM

## 2021-11-17 MED ORDER — LOSARTAN POTASSIUM 50 MG PO TABS: 50.0000 mg | ORAL_TABLET | Freq: Every day | ORAL | 0 refills | Status: DC

## 2021-11-17 MED ORDER — LOSARTAN POTASSIUM 50 MG PO TABS: 50.0000 mg | ORAL_TABLET | Freq: Every day | ORAL | 1 refills | Status: DC

## 2021-11-17 NOTE — Telephone Encounter (Signed)
Meds ordered this encounter  Medications   losartan (COZAAR) 50 MG tablet    Sig: Take 1 tablet (50 mg total) by mouth daily.    Dispense:  90 tablet    Refill:  1

## 2021-11-17 NOTE — Telephone Encounter (Signed)
Sent 10 days of medication to hold patient until mail order Rx arrives.

## 2021-11-23 ENCOUNTER — Ambulatory Visit: Payer: BC Managed Care – PPO | Admitting: Family Medicine

## 2021-11-30 ENCOUNTER — Ambulatory Visit: Payer: BC Managed Care – PPO | Admitting: Family Medicine

## 2022-01-10 ENCOUNTER — Encounter: Payer: Self-pay | Admitting: Family Medicine

## 2022-01-10 DIAGNOSIS — D582 Other hemoglobinopathies: Secondary | ICD-10-CM

## 2022-01-12 NOTE — Telephone Encounter (Signed)
D58.2: abnormal hemoglogin   May actually have to call the insurance company on this 1.  It sounds like initially [facility] did not submit for physical code.  So I am still not even sure if its been submitted.  I am just do not know what else to do on this 1.  We can try for some more codes but I would need to know specifically which ones they are not covering.

## 2022-03-14 DIAGNOSIS — C73 Malignant neoplasm of thyroid gland: Secondary | ICD-10-CM | POA: Diagnosis not present

## 2022-03-14 DIAGNOSIS — E890 Postprocedural hypothyroidism: Secondary | ICD-10-CM | POA: Diagnosis not present

## 2022-03-21 DIAGNOSIS — E890 Postprocedural hypothyroidism: Secondary | ICD-10-CM | POA: Diagnosis not present

## 2022-03-21 DIAGNOSIS — C73 Malignant neoplasm of thyroid gland: Secondary | ICD-10-CM | POA: Diagnosis not present

## 2022-03-21 DIAGNOSIS — Z79890 Hormone replacement therapy (postmenopausal): Secondary | ICD-10-CM | POA: Diagnosis not present

## 2022-03-24 ENCOUNTER — Encounter: Payer: Self-pay | Admitting: Family Medicine

## 2022-03-24 ENCOUNTER — Ambulatory Visit: Payer: BC Managed Care – PPO | Admitting: Family Medicine

## 2022-03-24 VITALS — BP 144/93 | HR 86 | Ht 59.0 in | Wt 136.0 lb

## 2022-03-24 DIAGNOSIS — C73 Malignant neoplasm of thyroid gland: Secondary | ICD-10-CM

## 2022-03-24 DIAGNOSIS — R519 Headache, unspecified: Secondary | ICD-10-CM

## 2022-03-24 DIAGNOSIS — I10 Essential (primary) hypertension: Secondary | ICD-10-CM

## 2022-03-24 MED ORDER — LOSARTAN POTASSIUM-HCTZ 100-12.5 MG PO TABS: 1.0000 | ORAL_TABLET | Freq: Every day | ORAL | 1 refills | Status: DC

## 2022-03-24 NOTE — Progress Notes (Signed)
Pt reports that she was seen by Endo yesterday and her bp was 179/97 they rechecked this 158/97. She stated that she has been experiencing some headaches.   She took her BP this morning @ 630 AM 149/97. She takes her BP medication Losartan 50 mg at 6 AM.

## 2022-03-24 NOTE — Assessment & Plan Note (Signed)
Not well controlled. Discussed DASH diet.  Will adjust BP pill and add hct.  Normally I would have her return in about 2 weeks but it is difficult to get here that she is can to send me her blood pressures on my chart so that we can take a look and make any adjustments needed.  Does not drink any excess caffeine or take stimulants etc.  She does have 1 caffeinated drink a day.

## 2022-03-24 NOTE — Assessment & Plan Note (Signed)
Follows closely with endocrinology.  Thinking about switching to a new endocrine later this year.  Scans and labs are up-to-date and reassuring.

## 2022-03-24 NOTE — Progress Notes (Signed)
   Established Patient Office Visit  Subjective   Patient ID: Sara Payne, female    DOB: [birth year]  Age: 52 y.o. MRN: [id number]  Chief Complaint  Patient presents with   Hypertension    HPI   Pt reports that she was seen by Endo yesterday and her bp was 179/97 they rechecked this 158/97. She stated that she has been experiencing some headaches.    She took her BP this morning @ 630 AM 149/97. She takes her BP medication Losartan 50 mg at 6 AM. + fam hx of HTN.  Has gained a little weight over the last year. Thyroid is well controlled. No recent changes to diet, etc.      ROS    Objective:     BP (!) 144/93   Pulse 86   Ht '4\' 11"'$  (1.499 m)   Wt 136 lb (61.7 kg)   SpO2 95%   BMI 27.47 kg/m    Physical Exam Vitals and nursing note reviewed.  Constitutional:      Appearance: She is well-developed.  HENT:     Head: Normocephalic and atraumatic.  Cardiovascular:     Rate and Rhythm: Normal rate and regular rhythm.     Heart sounds: Normal heart sounds.  Pulmonary:     Effort: Pulmonary effort is normal.     Breath sounds: Normal breath sounds.  Skin:    General: Skin is warm and dry.  Neurological:     Mental Status: She is alert and oriented to person, place, and time.  Psychiatric:        Behavior: Behavior normal.      No results found for any visits on 03/24/22.    The 10-year ASCVD risk score (Arnett DK, et al., 2019) is: 1.8%    Assessment & Plan:   Problem List Items Addressed This Visit       Cardiovascular and Mediastinum   Hypertension - Primary    Not well controlled. Discussed DASH diet.  Will adjust BP pill and add hct.  Normally I would have her return in about 2 weeks but it is difficult to get here that she is can to send me her blood pressures on my chart so that we can take a look and make any adjustments needed.  Does not drink any excess caffeine or take stimulants etc.  She does have 1 caffeinated drink a day.       Relevant Medications   losartan-hydrochlorothiazide (HYZAAR) 100-12.5 MG tablet     Endocrine   Papillary adenocarcinoma of thyroid ([place])    Follows closely with endocrinology.  Thinking about switching to a new endocrine later this year.  Scans and labs are up-to-date and reassuring.      Other Visit Diagnoses     Acute nonintractable headache, unspecified headache type          If headaches or not improving as blood pressure is improving then please let us know.  Return in about 3 months (around 06/22/2022) for Hypertension.    Beatrice Lecher, MD

## 2022-03-30 DIAGNOSIS — D2361 Other benign neoplasm of skin of right upper limb, including shoulder: Secondary | ICD-10-CM | POA: Diagnosis not present

## 2022-03-30 DIAGNOSIS — L814 Other melanin hyperpigmentation: Secondary | ICD-10-CM | POA: Diagnosis not present

## 2022-03-30 DIAGNOSIS — L821 Other seborrheic keratosis: Secondary | ICD-10-CM | POA: Diagnosis not present

## 2022-03-30 DIAGNOSIS — D3701 Neoplasm of uncertain behavior of lip: Secondary | ICD-10-CM | POA: Diagnosis not present

## 2022-04-21 DIAGNOSIS — R1084 Generalized abdominal pain: Secondary | ICD-10-CM | POA: Diagnosis not present

## 2022-04-21 DIAGNOSIS — N809 Endometriosis, unspecified: Secondary | ICD-10-CM | POA: Diagnosis not present

## 2022-04-24 ENCOUNTER — Encounter: Payer: Self-pay | Admitting: Family Medicine

## 2022-05-10 ENCOUNTER — Encounter: Payer: Self-pay | Admitting: Family Medicine

## 2022-05-10 NOTE — Telephone Encounter (Signed)
Yes lets do an appointment just so that we can be thorough and make sure that we are doing the right thing but it absolutely could be the blood pressure medication.  It is rare with an ARB and much more common with an ACE inhibitor.  But it is still possible.

## 2022-05-11 NOTE — Telephone Encounter (Signed)
Patient scheduled.

## 2022-05-20 ENCOUNTER — Encounter: Payer: Self-pay | Admitting: Family Medicine

## 2022-05-24 MED ORDER — LOSARTAN POTASSIUM-HCTZ 100-12.5 MG PO TABS: 1.0000 | ORAL_TABLET | Freq: Every day | ORAL | 1 refills | Status: DC

## 2022-05-25 ENCOUNTER — Ambulatory Visit: Payer: BC Managed Care – PPO | Admitting: Family Medicine

## 2022-07-12 ENCOUNTER — Other Ambulatory Visit: Payer: Self-pay | Admitting: Obstetrics and Gynecology

## 2022-07-12 DIAGNOSIS — Z0000 Encounter for general adult medical examination without abnormal findings: Secondary | ICD-10-CM

## 2022-08-05 ENCOUNTER — Telehealth: Payer: Self-pay | Admitting: Family Medicine

## 2022-08-05 DIAGNOSIS — I10 Essential (primary) hypertension: Secondary | ICD-10-CM

## 2022-08-05 DIAGNOSIS — E559 Vitamin D deficiency, unspecified: Secondary | ICD-10-CM

## 2022-08-05 DIAGNOSIS — D582 Other hemoglobinopathies: Secondary | ICD-10-CM

## 2022-08-05 DIAGNOSIS — Z0000 Encounter for general adult medical examination without abnormal findings: Secondary | ICD-10-CM

## 2022-08-05 DIAGNOSIS — E039 Hypothyroidism, unspecified: Secondary | ICD-10-CM

## 2022-08-05 DIAGNOSIS — Z131 Encounter for screening for diabetes mellitus: Secondary | ICD-10-CM

## 2022-08-05 DIAGNOSIS — Z13220 Encounter for screening for lipoid disorders: Secondary | ICD-10-CM

## 2022-08-05 NOTE — Telephone Encounter (Signed)
Patient request labs prior to CPE. Please order. 

## 2022-08-08 DIAGNOSIS — H5211 Myopia, right eye: Secondary | ICD-10-CM | POA: Diagnosis not present

## 2022-08-08 DIAGNOSIS — H4720 Unspecified optic atrophy: Secondary | ICD-10-CM | POA: Diagnosis not present

## 2022-08-18 ENCOUNTER — Ambulatory Visit
Admission: RE | Admit: 2022-08-18 | Discharge: 2022-08-18 | Disposition: A | Discharge status: Home / Self Care | Payer: BC Managed Care – PPO | Source: Ambulatory Visit | Attending: Obstetrics and Gynecology | Admitting: Obstetrics and Gynecology

## 2022-08-18 DIAGNOSIS — Z1231 Encounter for screening mammogram for malignant neoplasm of breast: Secondary | ICD-10-CM | POA: Diagnosis not present

## 2022-08-18 DIAGNOSIS — Z0000 Encounter for general adult medical examination without abnormal findings: Secondary | ICD-10-CM

## 2022-09-15 ENCOUNTER — Other Ambulatory Visit: Payer: Self-pay | Admitting: *Deleted

## 2022-09-15 ENCOUNTER — Telehealth: Payer: Self-pay | Admitting: Family Medicine

## 2022-09-15 DIAGNOSIS — Z01419 Encounter for gynecological examination (general) (routine) without abnormal findings: Secondary | ICD-10-CM | POA: Diagnosis not present

## 2022-09-15 DIAGNOSIS — Z124 Encounter for screening for malignant neoplasm of cervix: Secondary | ICD-10-CM | POA: Diagnosis not present

## 2022-09-15 DIAGNOSIS — Z0000 Encounter for general adult medical examination without abnormal findings: Secondary | ICD-10-CM

## 2022-09-15 NOTE — Telephone Encounter (Signed)
Labs ordered. Please advise pt

## 2022-09-15 NOTE — Telephone Encounter (Signed)
Patient request labs prior to CPE. Please order. 

## 2022-09-16 NOTE — Telephone Encounter (Signed)
Called pt and informed them lab orders have been placed.

## 2022-09-19 DIAGNOSIS — Z0000 Encounter for general adult medical examination without abnormal findings: Secondary | ICD-10-CM | POA: Diagnosis not present

## 2022-09-20 ENCOUNTER — Ambulatory Visit (INDEPENDENT_AMBULATORY_CARE_PROVIDER_SITE_OTHER): Payer: BC Managed Care – PPO | Admitting: Family Medicine

## 2022-09-20 VITALS — BP 129/75 | HR 85 | Ht 59.0 in | Wt 136.0 lb

## 2022-09-20 DIAGNOSIS — Z0000 Encounter for general adult medical examination without abnormal findings: Secondary | ICD-10-CM

## 2022-09-20 MED ORDER — LOSARTAN POTASSIUM-HCTZ 100-12.5 MG PO TABS: 1.0000 | ORAL_TABLET | Freq: Every day | ORAL | 1 refills | Status: DC

## 2022-09-20 NOTE — Progress Notes (Signed)
Reviewed labs at CPE.

## 2022-09-20 NOTE — Progress Notes (Signed)
Complete physical exam  Patient: Sara Payne   DOB: [birth year]   52 y.o. Female  MRN: [id number]  Subjective:    Chief Complaint  Patient presents with  . Annual Exam    Zulma Jelesa Saeger is a 52 y.o. female who presents today for a complete physical exam. She reports consuming a general diet. The patient does not participate in regular exercise at present. She generally feels well. She reports sleeping fairly well. She does not have additional problems to discuss today.   She does follow with GYN and just had her Pap smear updated.  They are going to hold her as a birth control to see if she is formally menopausal she is now 41.  Most recent fall risk assessment:    09/20/2022    9:57 AM  Fall Risk   Falls in the past year? 0  Number falls in past yr: 0  Injury with Fall? 0  Risk for fall due to : No Fall Risks  Follow up Falls evaluation completed     Most recent depression screenings:    09/20/2022    9:57 AM 08/02/2021    1:31 PM  PHQ 2/9 Scores  PHQ - 2 Score 0 0         Patient Care Team: Agapito Games, MD as PCP - General (Family Medicine) Toy Baker, DO as Consulting Physician (Obstetrics and Gynecology)   Outpatient Medications Prior to Visit  Medication Sig  . Calcium Carb-Cholecalciferol (CALCIUM/VITAMIN D PO) Take by mouth.  . cetirizine (ZYRTEC) 10 MG tablet Take 10 mg by mouth at bedtime.  . fexofenadine (ALLEGRA) 180 MG tablet Take 180 mg by mouth as needed for allergies or rhinitis.  Marland Kitchen LEVOXYL 125 MCG tablet Take 125 mcg by mouth daily.  . Multiple Vitamin (MULTIVITAMIN) tablet Take 1 tablet by mouth at bedtime.   . [DISCONTINUED] losartan-hydrochlorothiazide (HYZAAR) 100-12.5 MG tablet Take 1 tablet by mouth daily.  Marland Kitchen [name] tablet Take 1 tablet by mouth daily. (Patient not taking: Reported on 09/20/2022)   No facility-administered medications prior to visit.    ROS        Objective:     BP 129/75   Pulse 85    Ht 4\' 11"  (1.499 m)   Wt 136 lb (61.7 kg)   SpO2 100%   BMI 27.47 kg/m     Physical Exam Vitals and nursing note reviewed.  Constitutional:      Appearance: Normal appearance. She is well-developed.  HENT:     Head: Normocephalic and atraumatic.     Right Ear: Tympanic membrane, ear canal and external ear normal.     Left Ear: Tympanic membrane, ear canal and external ear normal.     Nose: Nose normal.     Mouth/Throat:     Pharynx: Oropharynx is clear.  Eyes:     Conjunctiva/sclera: Conjunctivae normal.     Pupils: Pupils are equal, round, and reactive to light.  Neck:     Thyroid: No thyromegaly.  Cardiovascular:     Rate and Rhythm: Normal rate and regular rhythm.     Heart sounds: Normal heart sounds.  Pulmonary:     Effort: Pulmonary effort is normal.     Breath sounds: Normal breath sounds. No wheezing.  Abdominal:     General: Bowel sounds are normal.     Palpations: Abdomen is soft.     Tenderness: There is no abdominal tenderness.  Musculoskeletal:  Cervical back: Neck supple.  Lymphadenopathy:     Cervical: No cervical adenopathy.  Skin:    General: Skin is warm and dry.  Neurological:     Mental Status: She is alert and oriented to person, place, and time.  Psychiatric:        Behavior: Behavior normal.     No results found for any visits on 09/20/22. Last thyroid functions Lab Results  Component Value Date   TSH 0.017 (L) 09/19/2022        Assessment & Plan:    Routine Health Maintenance and Physical Exam  Immunization History  Administered Date(s) Administered  . Influenza Split 11/15/2011, 11/26/2013, 12/02/2014  . Influenza, Quadrivalent, Recombinant, Inj, Pf 12/18/2020  . Influenza,inj,Quad PF,6+ Mos 11/28/2017, 12/04/2018  . PFIZER(Purple Top)SARS-COV-2 Vaccination 04/19/2019, 05/13/2019  . [employer] 66yrs & up 01/12/2021  . Tdap 08/02/2021  . Zoster Recombinant(Shingrix) 08/02/2021    Health  Maintenance  Topic Date Due  . PAP SMEAR-Modifier  10/22/2022 (Originally 08/07/2022)  . Hepatitis C Screening  03/25/2023 (Originally 08/16/1988)  . HIV Screening  03/25/2023 (Originally 08/16/1985)  . Zoster Vaccines- Shingrix (2 of 2) 03/25/2023 (Originally 09/27/2021)  . COVID-19 Vaccine (4 - 2023-24 season) 04/10/2023 (Originally 10/22/2021)  . INFLUENZA VACCINE  09/22/2022  . MAMMOGRAM  08/17/2024  . Colonoscopy  07/28/2031  . DTaP/Tdap/Td (2 - Td or Tdap) 08/03/2031  . Pneumococcal Vaccine 78-54 Years old  Aged Out  . HPV VACCINES  Aged Out    Discussed health benefits of physical activity, and encouraged her to engage in regular exercise appropriate for her age and condition.  Problem List Items Addressed This Visit   None Visit Diagnoses     Wellness examination    -  Primary     Keep up a regular exercise program and make sure you are eating a healthy diet Try to eat 4 servings of dairy a day, or if you are lactose intolerant take a calcium with vitamin D daily.  Your vaccines are up to date.    Will schedule second shingles vaccine after her oral surgery that she has later this week.   No follow-ups on file.     Nani Gasser, MD

## 2023-01-09 DIAGNOSIS — Z124 Encounter for screening for malignant neoplasm of cervix: Secondary | ICD-10-CM | POA: Diagnosis not present

## 2023-01-09 DIAGNOSIS — Z01419 Encounter for gynecological examination (general) (routine) without abnormal findings: Secondary | ICD-10-CM | POA: Diagnosis not present

## 2023-01-09 DIAGNOSIS — Z01411 Encounter for gynecological examination (general) (routine) with abnormal findings: Secondary | ICD-10-CM | POA: Diagnosis not present

## 2023-01-09 DIAGNOSIS — Z113 Encounter for screening for infections with a predominantly sexual mode of transmission: Secondary | ICD-10-CM | POA: Diagnosis not present

## 2023-01-12 ENCOUNTER — Ambulatory Visit: Payer: BC Managed Care – PPO | Admitting: Family Medicine

## 2023-01-12 ENCOUNTER — Telehealth: Payer: Self-pay | Admitting: Family Medicine

## 2023-01-12 ENCOUNTER — Encounter: Payer: Self-pay | Admitting: Family Medicine

## 2023-01-12 VITALS — BP 128/75 | HR 70 | Ht 59.0 in | Wt 136.0 lb

## 2023-01-12 DIAGNOSIS — R519 Headache, unspecified: Secondary | ICD-10-CM | POA: Diagnosis not present

## 2023-01-12 DIAGNOSIS — R42 Dizziness and giddiness: Secondary | ICD-10-CM | POA: Diagnosis not present

## 2023-01-12 NOTE — Telephone Encounter (Signed)
error 

## 2023-01-12 NOTE — Patient Instructions (Signed)
Cut BP pill in half and take daily through the weekend and see if you feel better.

## 2023-01-12 NOTE — Progress Notes (Signed)
Acute Office Visit  Subjective:     Patient ID: Sara Payne, female    DOB: [birth year], 52 y.o.   MRN: [id number]  Chief Complaint  Patient presents with   Dizziness   Headache    HPI Patient is in today for feeling a little dizzy and lightheaded.  She said she has noticed recently just with changing position she might feel just a little lightheaded just for second or 2 but then yesterday the feeling was much more intense and she also had a headache with it.  Today she actually started feeling a little nauseated.  She had the school nurse check her blood pressure and it was 102/68.  She notices more the dizziness with change in position not with head movement so much.  She denies any recent weight loss.  No new medication she did start an over-the-counter magnesium supplement for hot flashes and it has been helpful and is actually improved her sleep quality as well.  ROS      Objective:    BP 128/75   Pulse 70   Ht 4\' 11"  (1.499 m)   Wt 136 lb (61.7 kg)   SpO2 100%   BMI 27.47 kg/m    Physical Exam Vitals and nursing note reviewed.  Constitutional:      Appearance: Normal appearance.  HENT:     Head: Normocephalic and atraumatic.     Right Ear: Tympanic membrane, ear canal and external ear normal. There is no impacted cerumen.     Left Ear: Tympanic membrane, ear canal and external ear normal. There is no impacted cerumen.     Nose: Nose normal.     Mouth/Throat:     Pharynx: Oropharynx is clear.  Eyes:     Conjunctiva/sclera: Conjunctivae normal.  Cardiovascular:     Rate and Rhythm: Normal rate and regular rhythm.  Pulmonary:     Effort: Pulmonary effort is normal.     Breath sounds: Normal breath sounds.  Musculoskeletal:     Cervical back: Neck supple. No tenderness.  Lymphadenopathy:     Cervical: No cervical adenopathy.  Skin:    General: Skin is warm and dry.  Neurological:     Mental Status: She is alert and oriented to person, place, and time.      Comments: Negative Dix-Hallpike maneuver.  Psychiatric:        Mood and Affect: Mood normal.     No results found for any visits on 01/12/23.      Assessment & Plan:   Problem List Items Addressed This Visit   None Visit Diagnoses     Acute nonintractable headache, unspecified headache type    -  Primary   Lightheadedness           Lightheadedness-it sounds like blood pressure was low this morning it looks a little better this afternoon.  We discussed maybe decreasing her blood pressure pill and splitting it in half at least through the weekend.  Making sure that she is hydrating well.  If not improving greatly we may end up holding the magnesium that is the only thing that she started recently.  We did discuss that if she starts developing any viral or respiratory symptoms to please let us know.  She will have the school nurse try to check her blood pressure again tomorrow and hopefully on Monday.  The statics were normal today.  Negative Dix-Hallpike maneuver, no sign of vertigo.    No orders of the defined  types were placed in this encounter.   No follow-ups on file.  Nani Gasser, MD

## 2023-01-21 ENCOUNTER — Encounter: Payer: Self-pay | Admitting: Family Medicine

## 2023-01-24 ENCOUNTER — Ambulatory Visit: Payer: BC Managed Care – PPO | Admitting: Physician Assistant

## 2023-03-14 DIAGNOSIS — E890 Postprocedural hypothyroidism: Secondary | ICD-10-CM | POA: Diagnosis not present

## 2023-03-14 DIAGNOSIS — C73 Malignant neoplasm of thyroid gland: Secondary | ICD-10-CM | POA: Diagnosis not present

## 2023-03-15 DIAGNOSIS — C73 Malignant neoplasm of thyroid gland: Secondary | ICD-10-CM | POA: Diagnosis not present

## 2023-03-20 DIAGNOSIS — E890 Postprocedural hypothyroidism: Secondary | ICD-10-CM | POA: Diagnosis not present

## 2023-03-20 DIAGNOSIS — C73 Malignant neoplasm of thyroid gland: Secondary | ICD-10-CM | POA: Diagnosis not present

## 2023-04-11 DIAGNOSIS — L821 Other seborrheic keratosis: Secondary | ICD-10-CM | POA: Diagnosis not present

## 2023-04-11 DIAGNOSIS — L814 Other melanin hyperpigmentation: Secondary | ICD-10-CM | POA: Diagnosis not present

## 2023-04-11 DIAGNOSIS — D225 Melanocytic nevi of trunk: Secondary | ICD-10-CM | POA: Diagnosis not present

## 2023-05-22 DIAGNOSIS — N63 Unspecified lump in unspecified breast: Secondary | ICD-10-CM | POA: Diagnosis not present

## 2023-05-22 DIAGNOSIS — N939 Abnormal uterine and vaginal bleeding, unspecified: Secondary | ICD-10-CM | POA: Diagnosis not present

## 2023-05-22 DIAGNOSIS — N950 Postmenopausal bleeding: Secondary | ICD-10-CM | POA: Diagnosis not present

## 2023-05-22 DIAGNOSIS — N859 Noninflammatory disorder of uterus, unspecified: Secondary | ICD-10-CM | POA: Diagnosis not present

## 2023-06-13 ENCOUNTER — Ambulatory Visit: Admitting: Family Medicine

## 2023-06-13 ENCOUNTER — Ambulatory Visit: Payer: Self-pay | Admitting: *Deleted

## 2023-06-13 ENCOUNTER — Encounter: Payer: Self-pay | Admitting: Family Medicine

## 2023-06-13 VITALS — BP 118/77 | HR 98 | Ht 59.0 in | Wt 142.0 lb

## 2023-06-13 DIAGNOSIS — J0140 Acute pansinusitis, unspecified: Secondary | ICD-10-CM | POA: Diagnosis not present

## 2023-06-13 MED ORDER — DOXYCYCLINE HYCLATE 100 MG PO TABS: 100.0000 mg | ORAL_TABLET | Freq: Two times a day (BID) | ORAL | 0 refills | Status: DC

## 2023-06-13 NOTE — Telephone Encounter (Signed)
 Copied from CRM [id number]. Topic: Clinical - Red Word Triage >> Jun 13, 2023  9:20 AM Sara Payne wrote: Red Word that prompted transfer to Nurse Triage: Sinues infection Sypmtoms: Headaches/teeth hurting top LT side been 1 week but 2-3 days getting worse Reason for Disposition  [1] Sinus pain (not just congestion) AND [2] fever  Answer Assessment - Initial Assessment Questions 1. LOCATION: "Where does it hurt?"      I think I have a sinus infection.    Started with ears clogged, nose clogged last week.   No I'm having headaches in my forehead and my teeth on left side are hurting.    2. ONSET: "When did the sinus pain start?"  (e.g., hours, days)      Last week 3. SEVERITY: "How bad is the pain?"   (Scale 1-10; mild, moderate or severe)   - MILD (1-3): doesn't interfere with normal activities    - MODERATE (4-7): interferes with normal activities (e.g., work or school) or awakens from sleep   - SEVERE (8-10): excruciating pain and patient unable to do any normal activities        Moderate.   I get them frequently. 4. RECURRENT SYMPTOM: "Have you ever had sinus problems before?" If Yes, ask: "When was the last time?" and "What happened that time?"      Yes 5. NASAL CONGESTION: "Is the nose blocked?" If Yes, ask: "Can you open it or must you breathe through your mouth?"     At night I take a decongestant but I'm still having pressure and congestion.   No runny nose.     6. NASAL DISCHARGE: "Do you have discharge from your nose?" If so ask, "What color?"     No 7. FEVER: "Do you have a fever?" If Yes, ask: "What is it, how was it measured, and when did it start?"      No 8. OTHER SYMPTOMS: "Do you have any other symptoms?" (e.g., sore throat, cough, earache, difficulty breathing)     Headache, ears with less pressure.  Coughing but it sounds congested but nothing is coming up      9. PREGNANCY: "Is there any chance you are pregnant?" "When was your last menstrual period?"     Not  asked  Protocols used: Sinus Pain or Congestion-A-AH  Chief Complaint: Sinus infection.    I get these frequently Symptoms: nasal congestion, no productive cough, ear congestion, headache/pressure pain in teeth on left side. Frequency: Last week started Pertinent Negatives: Patient denies fever Disposition: [] ED /[] Urgent Care (no appt availability in office) / [x] Appointment(In office/virtual)/ []  [facility]/ [] Home Care/ [] Refused Recommended Disposition /[] Page Mobile Bus/ []  Follow-up with PCP Additional Notes: Appt made for today with Dr. Augustus Ledger.

## 2023-06-13 NOTE — Telephone Encounter (Signed)
 Patient seen today by Dr. Augustus Ledger

## 2023-06-13 NOTE — Assessment & Plan Note (Signed)
 Treating with 10 day course of doxycycline .  Continue supportive care.  Red flags and reasons to return to clinic reviewed.

## 2023-06-13 NOTE — Patient Instructions (Signed)

## 2023-06-13 NOTE — Progress Notes (Signed)
 Sara Payne - 53 y.o. female MRN [id number]  Date of birth: [birth year]  Subjective Chief Complaint  Patient presents with   Sinusitis    HPI Sara Payne is a 53 y.o. female here today with complaint of nasal congestion, sinus pain and pressure, frontal headaches,  and cough.  Symptoms started about 1 week ago and have continued to worsen since onset.  Sinuses are pretty tender to touch with pain in her upper teeth. She has tried several OTC medication including antihistamines, decongestants and nasal sprays with minimal relief.  She denies fever, chills, wheezing or dyspnea.    ROS:  A comprehensive ROS was completed and negative except as noted per HPI   Allergies  Allergen Reactions   Sulfa Antibiotics Rash    Past Medical History:  Diagnosis Date   Anemia    Fibromyalgia    GERD (gastroesophageal reflux disease)    Hashimoto disease 02/21/2002   Heart murmur    Post-operative nausea and vomiting    Pregnancy complication    G2P1: 0,11   Thyroid  cancer (HCC) 02/21/2002    Past Surgical History:  Procedure Laterality Date   LAPAROSCOPY     THYROIDECTOMY  2004   removed lymphnodes 2006    Social History   Socioeconomic History   Marital status: Married    Spouse name: Not on file   Number of children: 1   Years of education: Not on file   Highest education level: Master's degree (e.g., MA, MS, MEng, MEd, MSW, MBA)  Occupational History   Occupation: Glass blower/designer  Tobacco Use   Smoking status: Never   Smokeless tobacco: Not on file  Vaping Use   Vaping status: Never Used  Substance and Sexual Activity   Alcohol use: No   Drug use: No   Sexual activity: Not on file  Other Topics Concern   Not on file  Social History Narrative   Married to Sara Payne Has a young son, Sara Payne. Exercises 30 minutes, 3 times a week On birth control pills. Tea a few times a week    Social Drivers of Corporate investment banker Strain: Low Risk  (01/23/2023)    Overall Financial Resource Strain (CARDIA)    Difficulty of Paying Living Expenses: Not very hard  Food Insecurity: Low Risk  (03/20/2023)   Received from [facility]   Hunger Vital Sign    Worried About Running Out of Food in the Last Year: Never true    Ran Out of Food in the Last Year: Never true  Transportation Needs: No Transportation Needs (03/20/2023)   Received from [employer]    In the past 12 months, has lack of reliable transportation kept you from medical appointments, meetings, work or from getting things needed for daily living? : No  Physical Activity: Insufficiently Active (01/23/2023)   Exercise Vital Sign    Days of Exercise per Week: 4 days    Minutes of Exercise per Session: 30 min  Stress: Stress Concern Present (01/23/2023)   Sara Payne of Occupational Health - Occupational Stress Questionnaire    Feeling of Stress : To some extent  Social Connections: Moderately Isolated (01/23/2023)   Social Connection and Isolation Panel [NHANES]    Frequency of Communication with Friends and Family: More than three times a week    Frequency of Social Gatherings with Friends and Family: Three times a week    Attends Religious Services: Never    Active Member  of Clubs or Organizations: No    Attends Engineer, structural: Not on file    Marital Status: Married    Family History  Problem Relation Age of Onset   Healthy Mother    Heart disease Father    Hypertension Father    Asthma Sister    Hypertension Brother    Breast cancer Maternal Grandmother    Colon cancer Neg Hx    Esophageal cancer Neg Hx    Stomach cancer Neg Hx    Rectal cancer Neg Hx     Health Maintenance  Topic Date Due   HIV Screening  Never done   Hepatitis C Screening  Never done   Zoster Vaccines- Shingrix  (2 of 2) 09/27/2021   COVID-19 Vaccine (4 - 2024-25 season) 01/28/2024 (Originally 10/23/2022)   INFLUENZA VACCINE  09/22/2023   Cervical Cancer Screening  (HPV/Pap Cotest)  08/06/2024   MAMMOGRAM  08/17/2024   Colonoscopy  07/28/2031   DTaP/Tdap/Td (2 - Td or Tdap) 08/03/2031   HPV VACCINES  Aged Out   Meningococcal B Vaccine  Aged Out     ----------------------------------------------------------------------------------------------------------------------------------------------------------------------------------------------------------------- Physical Exam BP 118/77 (BP Location: Left Arm, Patient Position: Sitting, Cuff Size: Normal)   Pulse 98   Ht 4\' 11"  (1.499 m)   Wt 142 lb (64.4 kg)   SpO2 99%   BMI 28.68 kg/m   Physical Exam Constitutional:      Appearance: Normal appearance.  HENT:     Head: Normocephalic and atraumatic.     Right Ear: Tympanic membrane normal.     Left Ear: Tympanic membrane normal.     Nose:     Comments: TTP along bilateral frontal and R maxillary sinus.  Eyes:     General: No scleral icterus. Cardiovascular:     Rate and Rhythm: Normal rate and regular rhythm.  Pulmonary:     Effort: Pulmonary effort is normal.     Breath sounds: Normal breath sounds.  Musculoskeletal:     Cervical back: Neck supple.  Neurological:     Mental Status: She is alert.  Psychiatric:        Mood and Affect: Mood normal.        Behavior: Behavior normal.     ------------------------------------------------------------------------------------------------------------------------------------------------------------------------------------------------------------------- Assessment and Plan  Acute pansinusitis Treating with 10 day course of doxycycline .  Continue supportive care.  Red flags and reasons to return to clinic reviewed.     Meds ordered this encounter  Medications   doxycycline  (VIBRA -TABS) 100 MG tablet    Sig: Take 1 tablet (100 mg total) by mouth 2 (two) times daily.    Dispense:  20 tablet    Refill:  0    No follow-ups on file.

## 2023-07-10 ENCOUNTER — Other Ambulatory Visit: Payer: Self-pay | Admitting: Obstetrics and Gynecology

## 2023-07-10 DIAGNOSIS — Z1231 Encounter for screening mammogram for malignant neoplasm of breast: Secondary | ICD-10-CM

## 2023-07-30 DIAGNOSIS — J020 Streptococcal pharyngitis: Secondary | ICD-10-CM | POA: Diagnosis not present

## 2023-07-30 DIAGNOSIS — R509 Fever, unspecified: Secondary | ICD-10-CM | POA: Diagnosis not present

## 2023-08-29 DIAGNOSIS — H53002 Unspecified amblyopia, left eye: Secondary | ICD-10-CM | POA: Diagnosis not present

## 2023-08-29 DIAGNOSIS — H5211 Myopia, right eye: Secondary | ICD-10-CM | POA: Diagnosis not present

## 2023-08-29 DIAGNOSIS — H5010 Unspecified exotropia: Secondary | ICD-10-CM | POA: Diagnosis not present

## 2023-08-30 ENCOUNTER — Ambulatory Visit
Admission: RE | Admit: 2023-08-30 | Discharge: 2023-08-30 | Disposition: A | Discharge status: Home / Self Care | Source: Ambulatory Visit | Attending: Obstetrics and Gynecology | Admitting: Obstetrics and Gynecology

## 2023-08-30 DIAGNOSIS — Z1231 Encounter for screening mammogram for malignant neoplasm of breast: Secondary | ICD-10-CM

## 2023-09-03 ENCOUNTER — Encounter: Payer: Self-pay | Admitting: Family Medicine

## 2023-09-03 DIAGNOSIS — E039 Hypothyroidism, unspecified: Secondary | ICD-10-CM

## 2023-09-03 DIAGNOSIS — E7800 Pure hypercholesterolemia, unspecified: Secondary | ICD-10-CM

## 2023-09-03 DIAGNOSIS — Z0000 Encounter for general adult medical examination without abnormal findings: Secondary | ICD-10-CM

## 2023-09-06 DIAGNOSIS — E7800 Pure hypercholesterolemia, unspecified: Secondary | ICD-10-CM | POA: Diagnosis not present

## 2023-09-06 DIAGNOSIS — E039 Hypothyroidism, unspecified: Secondary | ICD-10-CM | POA: Diagnosis not present

## 2023-09-06 DIAGNOSIS — Z0000 Encounter for general adult medical examination without abnormal findings: Secondary | ICD-10-CM | POA: Diagnosis not present

## 2023-09-07 ENCOUNTER — Ambulatory Visit: Payer: Self-pay | Admitting: Family Medicine

## 2023-09-07 ENCOUNTER — Other Ambulatory Visit: Payer: Self-pay | Admitting: Family Medicine

## 2023-09-07 LAB — CMP14+EGFR
ALT: 18 IU/L (ref 0–32)
AST: 19 IU/L (ref 0–40)
Albumin: 4.1 g/dL (ref 3.8–4.9)
Alkaline Phosphatase: 108 IU/L (ref 44–121)
BUN/Creatinine Ratio: 26 — ABNORMAL HIGH (ref 9–23)
BUN: 19 mg/dL (ref 6–24)
Bilirubin Total: 0.6 mg/dL (ref 0.0–1.2)
CO2: 21 mmol/L (ref 20–29)
Calcium: 9.2 mg/dL (ref 8.7–10.2)
Chloride: 103 mmol/L (ref 96–106)
Creatinine, Ser: 0.72 mg/dL (ref 0.57–1.00)
Globulin, Total: 2.9 g/dL (ref 1.5–4.5)
Glucose: 128 mg/dL — ABNORMAL HIGH (ref 70–99)
Potassium: 4.2 mmol/L (ref 3.5–5.2)
Sodium: 140 mmol/L (ref 134–144)
Total Protein: 7 g/dL (ref 6.0–8.5)
eGFR: 100 mL/min/1.73

## 2023-09-07 LAB — CBC WITH DIFFERENTIAL/PLATELET
Basophils Absolute: 0 x10E3/uL (ref 0.0–0.2)
Basos: 0 %
EOS (ABSOLUTE): 0.2 x10E3/uL (ref 0.0–0.4)
Eos: 2 %
Hematocrit: 44.2 % (ref 34.0–46.6)
Hemoglobin: 14.5 g/dL (ref 11.1–15.9)
Immature Grans (Abs): 0 x10E3/uL (ref 0.0–0.1)
Immature Granulocytes: 0 %
Lymphocytes Absolute: 2.3 x10E3/uL (ref 0.7–3.1)
Lymphs: 22 %
MCH: 29.4 pg (ref 26.6–33.0)
MCHC: 32.8 g/dL (ref 31.5–35.7)
MCV: 90 fL (ref 79–97)
Monocytes Absolute: 0.6 x10E3/uL (ref 0.1–0.9)
Monocytes: 6 %
Neutrophils Absolute: 7.4 x10E3/uL — ABNORMAL HIGH (ref 1.4–7.0)
Neutrophils: 70 %
Platelets: 340 x10E3/uL (ref 150–450)
RBC: 4.94 x10E6/uL (ref 3.77–5.28)
RDW: 12.3 % (ref 11.7–15.4)
WBC: 10.5 x10E3/uL (ref 3.4–10.8)

## 2023-09-07 LAB — LIPID PANEL
Chol/HDL Ratio: 3 ratio (ref 0.0–4.4)
Cholesterol, Total: 175 mg/dL (ref 100–199)
HDL: 58 mg/dL (ref >39)
LDL Chol Calc (NIH): 103 mg/dL — ABNORMAL HIGH (ref 0–99)
Triglycerides: 75 mg/dL (ref 0–149)
VLDL Cholesterol Cal: 14 mg/dL (ref 5–40)

## 2023-09-07 LAB — TSH: TSH: 0.007 u[IU]/mL — ABNORMAL LOW (ref 0.450–4.500)

## 2023-09-07 NOTE — Progress Notes (Signed)
 Hi Alithia, I know you are coming in in a couple weeks and we can go over the labs in more detail but just wanted to let you know the LDL is looking a little better.  And your thyroid  is still off in fact it is even more overly suppressed than it was.  So you really might want to talk to your endocrinologist.  Your medicine definitely needs to be adjusted.

## 2023-09-18 DIAGNOSIS — Z1331 Encounter for screening for depression: Secondary | ICD-10-CM | POA: Diagnosis not present

## 2023-09-18 DIAGNOSIS — Z01419 Encounter for gynecological examination (general) (routine) without abnormal findings: Secondary | ICD-10-CM | POA: Diagnosis not present

## 2023-09-20 ENCOUNTER — Ambulatory Visit (INDEPENDENT_AMBULATORY_CARE_PROVIDER_SITE_OTHER): Payer: BC Managed Care – PPO | Admitting: Family Medicine

## 2023-09-20 ENCOUNTER — Encounter: Payer: Self-pay | Admitting: Family Medicine

## 2023-09-20 VITALS — BP 122/78 | HR 76 | Ht 59.0 in | Wt 138.9 lb

## 2023-09-20 DIAGNOSIS — D582 Other hemoglobinopathies: Secondary | ICD-10-CM | POA: Diagnosis not present

## 2023-09-20 DIAGNOSIS — I10 Essential (primary) hypertension: Secondary | ICD-10-CM | POA: Diagnosis not present

## 2023-09-20 DIAGNOSIS — Z23 Encounter for immunization: Secondary | ICD-10-CM

## 2023-09-20 DIAGNOSIS — Z0000 Encounter for general adult medical examination without abnormal findings: Secondary | ICD-10-CM | POA: Diagnosis not present

## 2023-09-20 LAB — POCT GLYCOSYLATED HEMOGLOBIN (HGB A1C): Hemoglobin A1C: 5.1 % (ref 4.0–5.6)

## 2023-09-20 MED ORDER — LOSARTAN POTASSIUM-HCTZ 50-12.5 MG PO TABS
1.0000 | ORAL_TABLET | Freq: Every day | ORAL | 3 refills | Status: DC
Start: 2023-09-20 — End: 2024-01-15

## 2023-09-20 NOTE — Progress Notes (Addendum)
 Complete physical exam  Patient: Sara Payne   DOB: Aug 01, 1970   53 y.o. Female  MRN: [id number]  Subjective:    Chief Complaint  Patient presents with   Annual Exam    Sara Payne is a 53 y.o. female who presents today for a complete physical exam. She reports consuming a general diet.   She generally feels well.  She does not have additional problems to discuss today.   She did do labs ahead of time.  Recent TSH was low.  Her endocrine wants to decrease her dosage to 112 mcg of the branded Levoxyl.  Most recent fall risk assessment:    09/20/2022    9:57 AM  Fall Risk   Falls in the past year? 0  Number falls in past yr: 0  Injury with Fall? 0  Risk for fall due to : No Fall Risks  Follow up Falls evaluation completed     Most recent depression screenings:    09/20/2022    9:57 AM 08/02/2021    1:31 PM  PHQ 2/9 Scores  PHQ - 2 Score 0 0      Social History   Tobacco Use   Smoking status: Never  Vaping Use   Vaping status: Never Used  Substance Use Topics   Alcohol use: No   Drug use: No      Patient Care Team: Alvan Dorothyann BIRCH, MD as PCP - General (Family Medicine) Law, Cassandra A, DO (Inactive) as Consulting Physician (Obstetrics and Gynecology)   Outpatient Medications Prior to Visit  Medication Sig   Calcium Carb-Cholecalciferol (CALCIUM/VITAMIN D  PO) Take by mouth.   cetirizine (ZYRTEC) 10 MG tablet Take 10 mg by mouth daily.   LEVOXYL 125 MCG tablet Take 125 mcg by mouth daily.   Multiple Vitamin (MULTIVITAMIN) tablet Take 1 tablet by mouth at bedtime.    [DISCONTINUED] losartan -hydrochlorothiazide (HYZAAR) 100-12.5 MG tablet Take 1 tablet by mouth daily.   No facility-administered medications prior to visit.    ROS        Objective:     BP 122/78   Pulse 76   Ht 4' 11 (1.499 m)   Wt 138 lb 14.4 oz (63 kg)   SpO2 99%   BMI 28.05 kg/m     Physical Exam Constitutional:      Appearance: Normal appearance.  HENT:      Head: Normocephalic and atraumatic.     Right Ear: Tympanic membrane, ear canal and external ear normal.     Left Ear: Tympanic membrane, ear canal and external ear normal.     Nose: Nose normal.     Mouth/Throat:     Pharynx: Oropharynx is clear.  Eyes:     Extraocular Movements: Extraocular movements intact.     Conjunctiva/sclera: Conjunctivae normal.     Pupils: Pupils are equal, round, and reactive to light.  Neck:     Thyroid : No thyromegaly.  Cardiovascular:     Rate and Rhythm: Normal rate and regular rhythm.  Pulmonary:     Effort: Pulmonary effort is normal.     Breath sounds: Normal breath sounds.  Abdominal:     General: Bowel sounds are normal.     Palpations: Abdomen is soft.     Tenderness: There is no abdominal tenderness.  Musculoskeletal:        General: No swelling.     Cervical back: Neck supple.  Skin:    General: Skin is warm and dry.  Neurological:  Mental Status: She is oriented to person, place, and time.  Psychiatric:        Mood and Affect: Mood normal.        Behavior: Behavior normal.      Results for orders placed or performed in visit on 09/20/23  POCT HgB A1C  Result Value Ref Range   Hemoglobin A1C 5.1 4.0 - 5.6 %   HbA1c POC (<> result, manual entry)     HbA1c, POC (prediabetic range)     HbA1c, POC (controlled diabetic range)     Last lipids Lab Results  Component Value Date   CHOL 175 09/06/2023   HDL 58 09/06/2023   LDLCALC 103 (H) 09/06/2023   TRIG 75 09/06/2023   CHOLHDL 3.0 09/06/2023        Assessment & Plan:    Routine Health Maintenance and Physical Exam  Immunization History  Administered Date(s) Administered   Influenza Split 11/15/2011, 11/26/2013, 12/02/2014   Influenza, Quadrivalent, Recombinant, Inj, Pf 12/18/2020   Influenza,inj,Quad PF,6+ Mos 11/28/2017, 12/04/2018   PFIZER(Purple Top)SARS-COV-2 Vaccination 04/19/2019, 05/13/2019   Pfizer Covid-19 Vaccine Bivalent Booster 21yrs & up 01/12/2021    Tdap 08/02/2021   Zoster Recombinant(Shingrix ) 08/02/2021, 09/20/2023    Health Maintenance  Topic Date Due   HIV Screening  Never done   Hepatitis C Screening  Never done   Hepatitis B Vaccines (1 of 3 - 19+ 3-dose series) Never done   COVID-19 Vaccine (4 - 2024-25 season) 01/28/2024 (Originally 10/23/2022)   INFLUENZA VACCINE  09/22/2023   Cervical Cancer Screening (HPV/Pap Cotest)  08/06/2024   MAMMOGRAM  08/29/2025   Colonoscopy  07/28/2031   DTaP/Tdap/Td (2 - Td or Tdap) 08/03/2031   Zoster Vaccines- Shingrix   Completed   HPV VACCINES  Aged Out   Meningococcal B Vaccine  Aged Out    Discussed health benefits of physical activity, and encouraged her to engage in regular exercise appropriate for her age and condition.  Problem List Items Addressed This Visit       Cardiovascular and Mediastinum   Hypertension   Has been splitting BP pill and doing well at home.  Will change dose to 50mg .        Relevant Medications   losartan -hydrochlorothiazide (HYZAAR) 50-12.5 MG tablet   Other Visit Diagnoses       Abnormal hemoglobin (HCC)    -  Primary   Relevant Orders   POCT HgB A1C (Completed)     Wellness examination            Abnormal glucose on labs - will check A1C today.  A1c looks fantastic and is in the normal range.  Did discuss that I am happy to try to send through her Levoxyl she is having some difficulty getting it through the insurance it sounds like maybe her mail-order cannot does not have it in stock.  She would like to switch to endocrinology within [facility] if at all possible I gave her Dr. Sheppard Jointer name.  I am happy to make referral if she would like.  Return in about 6 months (around 03/22/2024) for Hypertension.     Dorothyann Byars, MD

## 2023-09-20 NOTE — Patient Instructions (Addendum)
 Dr. Stefano Butts, Endocrinology at [facility]

## 2023-09-20 NOTE — Assessment & Plan Note (Signed)
 Has been splitting BP pill and doing well at home.  Will change dose to 50mg .

## 2023-09-22 ENCOUNTER — Ambulatory Visit: Payer: Self-pay

## 2023-09-22 NOTE — Telephone Encounter (Signed)
 FYI Only or Action Required?: Action required by provider: Pt has rash following shingles vaccine on weds Pt needs to know if she is contagious as she has an event this evening. Pt is requesting a call back .  Patient was last seen in primary care on 09/20/2023 by Alvan Dorothyann BIRCH, MD.  Called Nurse Triage reporting Immunizations and Herpes Zoster.  Symptoms began yesterday.   Interventions attempted: Nothing.  Symptoms are: gradually improving. Pt has rash above injection site.  Triage Disposition: Home Care  Patient/caregiver understands and will follow disposition?: Yes                     Copied from CRM [id number]. Topic: Clinical - Red Word Triage >> Sep 22, 2023  8:07 AM Alfonso ORN wrote: Red Word that prompted transfer to Nurse Triage: shingle vaccine on Wednesday 09/20/23 left shoulder and chest where got vaccine have red spots looks like chicken pox patient callback [phone] Reason for Disposition  Shingles (Herpes zoster; Shingrix ) vaccine reactions  Answer Assessment - Initial Assessment Questions 1. SYMPTOMS: What is the main symptom? (e.g., pain, redness, or swelling at injection site; feeling tired, fever, muscle aches)      Rash above injection site, fever of 100 yesterday, nausea 2. ONSET: When was the vaccine (shot) given? How much later did the 1 day begin? (e.g., hours, days ago)      Shot on [employer], Reaction started yesterday - nausea, fever and rash today 3. SEVERITY: How bad is it?      mild 4. FEVER: Do you have a fever? If Yes, ask: What is your temperature, how was it measured, and when did it start?      Has resolved  5. IMMUNIZATIONS GIVEN: What shots have you recently received?     Shingles 6. PAST REACTIONS: Have you reacted to immunizations before? If Yes, ask: What happened?     COVID  - sick for 1 week 7. OTHER SYMPTOMS: Do you have any other symptoms?     resolved  Protocols used: Immunization  Reactions-A-AH

## 2023-09-22 NOTE — Telephone Encounter (Signed)
 Patient called back to report her rash has resolved.

## 2023-10-10 ENCOUNTER — Other Ambulatory Visit: Payer: Self-pay | Admitting: Physician Assistant

## 2023-10-10 DIAGNOSIS — E785 Hyperlipidemia, unspecified: Secondary | ICD-10-CM

## 2023-12-28 ENCOUNTER — Encounter: Payer: Self-pay | Admitting: Family Medicine

## 2024-01-12 ENCOUNTER — Encounter: Payer: Self-pay | Admitting: Family Medicine

## 2024-01-12 DIAGNOSIS — I10 Essential (primary) hypertension: Secondary | ICD-10-CM

## 2024-01-15 MED ORDER — LOSARTAN POTASSIUM-HCTZ 50-12.5 MG PO TABS: 1.0000 | ORAL_TABLET | Freq: Every day | ORAL | 3 refills | Status: AC

## 2024-03-13 ENCOUNTER — Ambulatory Visit: Payer: Self-pay

## 2024-03-13 ENCOUNTER — Ambulatory Visit: Admitting: Family Medicine

## 2024-03-13 ENCOUNTER — Encounter: Payer: Self-pay | Admitting: Family Medicine

## 2024-03-13 VITALS — BP 128/74 | HR 115 | Temp 98.0°F | Ht 59.0 in | Wt 131.8 lb

## 2024-03-13 DIAGNOSIS — J0140 Acute pansinusitis, unspecified: Secondary | ICD-10-CM

## 2024-03-13 MED ORDER — HYDROCOD POLI-CHLORPHE POLI ER 10-8 MG/5ML PO SUER: 5.0000 mL | Freq: Two times a day (BID) | ORAL | 0 refills | Status: AC | PRN

## 2024-03-13 MED ORDER — AMOXICILLIN-POT CLAVULANATE 875-125 MG PO TABS: 1.0000 | ORAL_TABLET | Freq: Two times a day (BID) | ORAL | 0 refills | Status: AC

## 2024-03-13 NOTE — Progress Notes (Signed)
 " Sara Payne - 54 y.o. female MRN [id number]  Date of birth: April 10, 1970  Subjective Chief Complaint  Patient presents with   Conjunctivitis   Cough    HPI Sara Payne is a 54 y.o. female here today with complaint of cough, congestion, pink eye, L ear pain, sinus pain and fatigue.  She had flu prior to Christmas along with residual cough.  This resolved however she developed new symptoms a few says ago.  So far she has tried mucinex, sudafed, Ayr nasal gel and leftover tessalon.  Tessalon has not really helped with cough so far.  She has had fever at initial onset.  No drainage from the ear.  Blood mixed in nasal mucus. No dizziness or nausea.    ROS:  A comprehensive ROS was completed and negative except as noted per HPI  Allergies[1]  Past Medical History:  Diagnosis Date   Anemia    Fibromyalgia    GERD (gastroesophageal reflux disease)    Hashimoto disease 02/21/2002   Heart murmur    Post-operative nausea and vomiting    Pregnancy complication    G2P1: 0,11   Thyroid  cancer (HCC) 02/21/2002    Past Surgical History:  Procedure Laterality Date   LAPAROSCOPY     THYROIDECTOMY  2004   removed lymphnodes 2006    Social History   Socioeconomic History   Marital status: Married    Spouse name: Not on file   Number of children: 1   Years of education: Not on file   Highest education level: Master's degree (e.g., MA, MS, MEng, MEd, MSW, MBA)  Occupational History   Occupation: Glass Blower/designer  Tobacco Use   Smoking status: Never   Smokeless tobacco: Not on file  Vaping Use   Vaping status: Never Used  Substance and Sexual Activity   Alcohol use: No   Drug use: No   Sexual activity: Not on file  Other Topics Concern   Not on file  Social History Narrative   Married to [place] 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 Exercises 30 minutes, 3 times a week On birth control pills. Tea a few times a week    Social Drivers of Health   Tobacco Use: Unknown (03/13/2024)    Patient History    Smoking Tobacco Use: Never    Smokeless Tobacco Use: Unknown    Passive Exposure: Not on file  Financial Resource Strain: Low Risk (09/16/2023)   Overall Financial Resource Strain (CARDIA)    Difficulty of Paying Living Expenses: Not very hard  Food Insecurity: No Food Insecurity (09/16/2023)   Epic    Worried About Radiation Protection Practitioner of Food in the Last Year: Never true    Ran Out of Food in the Last Year: Never true  Transportation Needs: No Transportation Needs (09/16/2023)   Epic    Lack of Transportation (Medical): No    Lack of Transportation (Non-Medical): No  Physical Activity: Sufficiently Active (09/16/2023)   Exercise Vital Sign    Days of Exercise per Week: 7 days    Minutes of Exercise per Session: 30 min  Stress: No Stress Concern Present (09/16/2023)   [employer] of Occupational Health - Occupational Stress Questionnaire    Feeling of Stress: Only a little  Social Connections: Moderately Isolated (09/16/2023)   Social Connection and Isolation Panel    Frequency of Communication with Friends and Family: Three times a week    Frequency of Social Gatherings with Friends and Family: Once a week  Attends Religious Services: Never    Active Member of Clubs or Organizations: No    Attends Banker Meetings: Not on file    Marital Status: Married  Depression (PHQ2-9): Low Risk (09/20/2022)   Depression (PHQ2-9)    PHQ-2 Score: 0  Alcohol Screen: Low Risk (09/16/2023)   Alcohol Screen    Last Alcohol Screening Score (AUDIT): 1  Housing: Low Risk (09/16/2023)   Epic    Unable to Pay for Housing in the Last Year: No    Number of Times Moved in the Last Year: 0    Homeless in the Last Year: No  Utilities: Low Risk (03/20/2023)   Received from [facility]   Utilities    In the past 12 months has the electric, gas, oil, or water company threatened to shut off services in your home? : No  Health Literacy: Not on file    Family History   Problem Relation Age of Onset   Healthy Mother    Heart disease Father    Hypertension Father    Asthma Sister    Hypertension Brother    Breast cancer Maternal Grandmother    Colon cancer Neg Hx    Esophageal cancer Neg Hx    Stomach cancer Neg Hx    Rectal cancer Neg Hx     Health Maintenance  Topic Date Due   HIV Screening  Never done   Hepatitis C Screening  Never done   Hepatitis B Vaccines 19-59 Average Risk (1 of 3 - 19+ 3-dose series) Never done   Pneumococcal Vaccine: 50+ Years (1 of 1 - PCV) Never done   Cervical Cancer Screening (HPV/Pap Cotest)  08/07/2022   Influenza Vaccine  09/22/2023   COVID-19 Vaccine (4 - 2025-26 season) 10/23/2023   Mammogram  08/29/2025   Colonoscopy  07/28/2031   DTaP/Tdap/Td (2 - Td or Tdap) 08/03/2031   HPV VACCINES (No Doses Required) Completed   Zoster Vaccines- Shingrix   Completed   Meningococcal B Vaccine  Aged Out     ----------------------------------------------------------------------------------------------------------------------------------------------------------------------------------------------------------------- Physical Exam BP 128/74 (BP Location: Left Arm, Patient Position: Sitting, Cuff Size: Normal)   Pulse (!) 115   Temp 98 F (36.7 C) (Temporal)   Ht 4' 11 (1.499 m)   Wt 131 lb 12.8 oz (59.8 kg)   SpO2 99%   BMI 26.62 kg/m   Physical Exam Constitutional:      Appearance: Normal appearance.  Eyes:     General: No scleral icterus. Cardiovascular:     Rate and Rhythm: Normal rate and regular rhythm.  Pulmonary:     Effort: Pulmonary effort is normal.     Breath sounds: Normal breath sounds.  Neurological:     Mental Status: She is alert.  Psychiatric:        Mood and Affect: Mood normal.        Behavior: Behavior normal.      ------------------------------------------------------------------------------------------------------------------------------------------------------------------------------------------------------------------- Assessment and Plan  Acute pansinusitis She has pansinusitis with L OM and conjuctivitis.  Treating with course of augmentin .  Rx for tussionex as needed for cough.  Encouraged supportive care as well with good fluid intake.  Red flags reviewed.  Contact clinic if symptom are worsening.     Meds ordered this encounter  Medications   amoxicillin -clavulanate (AUGMENTIN ) 875-125 MG tablet    Sig: Take 1 tablet by mouth 2 (two) times daily.    Dispense:  20 tablet    Refill:  0   chlorpheniramine-HYDROcodone (TUSSIONEX) 10-8 MG/5ML  Sig: Take 5 mLs by mouth every 12 (twelve) hours as needed for cough (cough, will cause drowsiness.).    Dispense:  120 mL    Refill:  0    No follow-ups on file.        [1]  Allergies Allergen Reactions   Sulfa Antibiotics Rash   "

## 2024-03-13 NOTE — Assessment & Plan Note (Signed)
 She has pansinusitis with L OM and conjuctivitis.  Treating with course of augmentin .  Rx for tussionex as needed for cough.  Encouraged supportive care as well with good fluid intake.  Red flags reviewed.  Contact clinic if symptom are worsening.

## 2024-03-13 NOTE — Telephone Encounter (Signed)
 FYI Only or Action Required?: FYI only for provider: appointment scheduled on 1/21.  Patient was last seen in primary care on 09/20/2023 by Alvan Dorothyann BIRCH, MD.  Called Nurse Triage reporting Cough.  Symptoms began about a month ago.  Interventions attempted: OTC medications: Sudafed.  Symptoms are: unchanged.  Triage Disposition: See Physician Within 24 Hours  Patient/caregiver understands and will follow disposition?: Yes       Message from [facility] sent at 03/13/2024  8:09 AM EST  Reason for Triage: cough fits ( green plegm), hard to breath, congestion - pink eye? sore throat.. can't smell or taste.. headache/body aches..   Reason for Disposition  SEVERE coughing spells (e.g., whooping sound after coughing, vomiting after coughing)  Answer Assessment - Initial Assessment Questions 1. ONSET: When did the cough begin?      X 1 month   2. SEVERITY: How bad is the cough today?      severe  3. SPUTUM: Describe the color of your sputum (e.g., none, dry cough; clear, white, yellow, green)     Green   4. HEMOPTYSIS: Are you coughing up any blood? If Yes, ask: How much? (e.g., flecks, streaks, tablespoons, etc.)     No   5. DIFFICULTY BREATHING: Are you having difficulty breathing? If Yes, ask: How bad is it? (e.g., mild, moderate, severe)      No   6. FEVER: Do you have a fever? If Yes, ask: What is your temperature, how was it measured, and when did it start?     No   7. CARDIAC HISTORY: Do you have any history of heart disease? (e.g., heart attack, congestive heart failure)      No   8. LUNG HISTORY: Do you have any history of lung disease?  (e.g., pulmonary embolus, asthma, emphysema)     No   10. OTHER SYMPTOMS: Do you have any other symptoms? (e.g., runny nose, wheezing, chest pain)       Chills, ear pain, loss of taste and smell      Patient called in to triage with complaints of productive cough,Congestion, sore throat.  This  has been ongoing for one month The patient stated last Friday she was seen in UC, all tests negative.  For home care, the patient is taking Sudafed  Appointment scheduled for further evaluation; Patient agrees with the plan of care, and will reach out if symptoms worsen or persist.  Protocols used: Cough - Acute Productive-A-AH

## 2024-03-13 NOTE — Telephone Encounter (Signed)
 Patient scheduled in office today with Dr. Alvia at 1:10pm

## 2024-03-14 ENCOUNTER — Encounter: Payer: Self-pay | Admitting: Family Medicine

## 2024-03-21 ENCOUNTER — Ambulatory Visit: Admitting: Family Medicine
